# Patient Record
Sex: Female | Born: 1985 | Race: White | Hispanic: No | Marital: Married | Smoking: Never smoker
Health system: Southern US, Community
[De-identification: ages and names within clinical notes are randomized; demographics above are authoritative.]

## PROBLEM LIST (undated history)

## (undated) DIAGNOSIS — T4145XA Adverse effect of unspecified anesthetic, initial encounter: Secondary | ICD-10-CM

## (undated) DIAGNOSIS — G43909 Migraine, unspecified, not intractable, without status migrainosus: Secondary | ICD-10-CM

## (undated) DIAGNOSIS — T883XXA Malignant hyperthermia due to anesthesia, initial encounter: Secondary | ICD-10-CM

## (undated) DIAGNOSIS — N200 Calculus of kidney: Secondary | ICD-10-CM

## (undated) DIAGNOSIS — T8859XA Other complications of anesthesia, initial encounter: Secondary | ICD-10-CM

## (undated) DIAGNOSIS — Z8489 Family history of other specified conditions: Secondary | ICD-10-CM

---
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
## 2012-11-15 HISTORY — PX: KIDNEY STONE SURGERY: SHX686

## 2016-06-28 ENCOUNTER — Emergency Department (HOSPITAL_BASED_OUTPATIENT_CLINIC_OR_DEPARTMENT_OTHER)
Admission: EM | Admit: 2016-06-28 | Discharge: 2016-06-28 | Disposition: A | Discharge status: Home / Self Care | Payer: BC Managed Care – PPO | Attending: Emergency Medicine | Admitting: Emergency Medicine

## 2016-06-28 ENCOUNTER — Encounter (HOSPITAL_BASED_OUTPATIENT_CLINIC_OR_DEPARTMENT_OTHER): Payer: Self-pay | Admitting: *Deleted

## 2016-06-28 DIAGNOSIS — G43809 Other migraine, not intractable, without status migrainosus: Secondary | ICD-10-CM | POA: Diagnosis not present

## 2016-06-28 DIAGNOSIS — R112 Nausea with vomiting, unspecified: Secondary | ICD-10-CM | POA: Diagnosis not present

## 2016-06-28 DIAGNOSIS — G43909 Migraine, unspecified, not intractable, without status migrainosus: Secondary | ICD-10-CM | POA: Diagnosis present

## 2016-06-28 HISTORY — DX: Migraine, unspecified, not intractable, without status migrainosus: G43.909

## 2016-06-28 HISTORY — DX: Calculus of kidney: N20.0

## 2016-06-28 MED ORDER — PROCHLORPERAZINE EDISYLATE 5 MG/ML IJ SOLN
10.0000 mg | Freq: Once | INTRAMUSCULAR | Status: AC
  Administered 2016-06-28: 10 mg via INTRAVENOUS
  Filled 2016-06-28: qty 2

## 2016-06-28 MED ORDER — SODIUM CHLORIDE 0.9 % IV BOLUS (SEPSIS)
1000.0000 mL | Freq: Once | INTRAVENOUS | Status: AC
  Administered 2016-06-28: 1000 mL via INTRAVENOUS

## 2016-06-28 MED ORDER — KETOROLAC TROMETHAMINE 30 MG/ML IJ SOLN
30.0000 mg | Freq: Once | INTRAMUSCULAR | Status: AC
  Administered 2016-06-28: 30 mg via INTRAVENOUS
  Filled 2016-06-28: qty 1

## 2016-06-28 MED ORDER — LORAZEPAM 2 MG/ML IJ SOLN
1.0000 mg | Freq: Once | INTRAMUSCULAR | Status: AC
  Administered 2016-06-28: 1 mg via INTRAVENOUS
  Filled 2016-06-28: qty 1

## 2016-06-28 MED ORDER — MAGNESIUM SULFATE 2 GM/50ML IV SOLN
2.0000 g | Freq: Once | INTRAVENOUS | Status: AC
  Administered 2016-06-28: 2 g via INTRAVENOUS
  Filled 2016-06-28: qty 50

## 2016-06-28 MED ORDER — SODIUM CHLORIDE 0.9 % IV BOLUS (SEPSIS): 1000.0000 mL | Freq: Once | INTRAVENOUS | Status: DC

## 2016-06-28 MED ORDER — DEXAMETHASONE SODIUM PHOSPHATE 10 MG/ML IJ SOLN
10.0000 mg | Freq: Once | INTRAMUSCULAR | Status: AC
  Administered 2016-06-28: 10 mg via INTRAVENOUS
  Filled 2016-06-28: qty 1

## 2016-06-28 NOTE — ED Triage Notes (Signed)
Headache. Hx of migraines. Nausea, light and sound sensitivity.

## 2016-06-28 NOTE — Discharge Instructions (Signed)
Try to rest, drink plenty of fluids. Follow up with headache specialist if continue to have migraines. Return if worsening.

## 2016-06-28 NOTE — ED Notes (Signed)
PA at bedside.

## 2016-06-28 NOTE — ED Provider Notes (Signed)
[facility]   CSN: [id number] Arrival date & time: 06/28/16  1035     History   Chief Complaint Chief Complaint  Patient presents with  . Migraine    HPI Denise [name] is a 30 y.o. female.  HPI Denise [name] is a 30 y.o. female with history of kidney stones and migraine headaches, presents to emergency department complaining of a headache. Patient states her headache started 5 days ago. She reports headache to the left side of the head and around her left eye. She reports photophobia, nausea, vomiting, dizziness. States this headache feels just like her prior migraines. She has taken Reglan and Zofran with no improvement. She denies any trauma. No fever or chills. No neck pain or stiffness. No other associated complaints. No neurological complaints.  Past Medical History:  Diagnosis Date  . Kidney stone   . Migraine     There are no active problems to display for this patient.   History reviewed. No pertinent surgical history.  OB History    No data available       Home Medications    Prior to Admission medications   Not on File    Family History No family history on file.  Social History Social History  Substance Use Topics  . Smoking status: Never Smoker  . Smokeless tobacco: Never Used  . Alcohol use Yes     Comment: occ     Allergies   Review of patient's allergies indicates no known allergies.   Review of Systems Review of Systems  Constitutional: Negative for chills and fever.  Respiratory: Negative for cough, chest tightness and shortness of breath.   Cardiovascular: Negative for chest pain, palpitations and leg swelling.  Gastrointestinal: Positive for nausea and vomiting. Negative for abdominal pain and diarrhea.  Genitourinary: Negative for dysuria, flank pain and pelvic pain.  Musculoskeletal: Negative for arthralgias, myalgias, neck pain and neck stiffness.  Skin: Negative for rash.  Neurological: Positive  for headaches. Negative for dizziness and weakness.  All other systems reviewed and are negative.    Physical Exam Updated Vital Signs BP 135/86 (BP Location: Left Arm)   Pulse 89   Temp 97.8 F (36.6 C) (Oral)   Resp 17   Ht 5\' 5"  (1.651 m)   Wt 95.3 kg   LMP 06/21/2016   SpO2 100%   BMI 34.95 kg/m   Physical Exam  Constitutional: She is oriented to person, place, and time. She appears well-developed and well-nourished. No distress.  HENT:  Head: Normocephalic and atraumatic.  Eyes: Conjunctivae and EOM are normal. Pupils are equal, round, and reactive to light.  Neck: Normal range of motion. Neck supple.  No meningismus  Cardiovascular: Normal rate, regular rhythm and normal heart sounds.   Pulmonary/Chest: Effort normal and breath sounds normal. No respiratory distress.  Musculoskeletal: She exhibits no edema.  Neurological: She is alert and oriented to person, place, and time. No cranial nerve deficit. She exhibits normal muscle tone. Coordination normal.  5/5 and equal upper and lower extremity strength bilaterally. Equal grip strength bilaterally. Normal finger to nose and heel to shin. No pronator drift.   Skin: Skin is warm and dry. No rash noted.  Psychiatric: She has a normal mood and affect. Her behavior is normal.  Nursing note and vitals reviewed.    ED Treatments / Results  Labs (all labs ordered are listed, but only abnormal results are displayed) Labs Reviewed - No data to display  EKG  EKG Interpretation None       Radiology No results found.  Procedures Procedures (including critical care time)  Medications Ordered in ED Medications  sodium chloride 0.9 % bolus 1,000 mL (1,000 mLs Intravenous New Bag/Given 06/28/16 1251)  magnesium sulfate IVPB 2 g 50 mL (not administered)  LORazepam (ATIVAN) injection 1 mg (not administered)  sodium chloride 0.9 % bolus 1,000 mL (not administered)  ketorolac (TORADOL) 30 MG/ML injection 30 mg (30 mg  Intravenous Given 06/28/16 1250)  prochlorperazine (COMPAZINE) injection 10 mg (10 mg Intravenous Given 06/28/16 1250)  dexamethasone (DECADRON) injection 10 mg (10 mg Intravenous Given 06/28/16 1250)     Initial Impression / Assessment and Plan / ED Course  I have reviewed the triage vital signs and the nursing notes.  Pertinent labs & imaging results that were available during my care of the patient were reviewed by me and considered in my medical decision making (see chart for details).  Clinical Course  Comment By Time  No headache improvement after toradol, compazine, decadron. Will try ativan and magnesium Jaynie Crumbleatyana Dublin Cantero, PA-C 08/14 1342  Pt feels much better. Plan to dc home with outpatient follow up.  Jaynie Crumbleatyana Levell Tavano, PA-C 08/14 1506    Pt in emergency dept with typical for her migraine. No neurological finding. Treated with migraine cocktail, with no improvement after toradol, compazine,decatron, and good pain relief after ativan and magnesium. VS normal. No signs of meningismus or trauma. Pt has multiple stressors and fostering 3 children under age of 2 including a 4wk old baby. Pt is Stable for dc home with close outpatient follow up.  Vitals:   06/28/16 1049 06/28/16 1051 06/28/16 1256 06/28/16 1504  BP:  141/96 135/86 119/64  Pulse:  101 89 103  Resp:  18 17 25   Temp:  97.8 F (36.6 C)    TempSrc:  Oral    SpO2:  100% 100% 95%  Weight: 95.3 kg     Height: 5\' 5"  (1.651 m)       Final Clinical Impressions(s) / ED Diagnoses   Final diagnoses:  Other migraine without status migrainosus, not intractable    New Prescriptions New Prescriptions   No medications on file     Jaynie Crumbleatyana Kendyn Zaman, PA-C 06/28/16 1512    Jerelyn ScottMartha Linker, MD 06/28/16 1527

## 2017-03-28 ENCOUNTER — Ambulatory Visit: Payer: BC Managed Care – PPO | Admitting: Obstetrics and Gynecology

## 2017-04-07 ENCOUNTER — Encounter: Payer: Self-pay | Admitting: Obstetrics & Gynecology

## 2017-04-07 ENCOUNTER — Ambulatory Visit (INDEPENDENT_AMBULATORY_CARE_PROVIDER_SITE_OTHER): Payer: BC Managed Care – PPO | Admitting: Obstetrics & Gynecology

## 2017-04-07 DIAGNOSIS — N96 Recurrent pregnancy loss: Secondary | ICD-10-CM | POA: Diagnosis not present

## 2017-04-07 NOTE — Progress Notes (Signed)
Patient ID: Denise [name], female   DOB: 05/01/1986, 31 y.o.   MRN: [id number]  Chief Complaint  Patient presents with  . Gynecologic Exam  she has had 4 early pregnancy losses  HPI Denise [name] is a 31 y.o. female.  [id number] Patient's last menstrual period was 03/30/2017. Menses are regular and cycle is 31 days. No pregnancy has progressed past 7 weeks and she has required no surgery. MTX was used for a possible ectopic pregnancy.  She was seeing an OB in [place] and has has no workup for recurrent losses. Notes from her family MD were reviewed. HPI  Past Medical History:  Diagnosis Date  . Kidney stone   . Migraine     Past Surgical History:  Procedure Laterality Date  . KIDNEY STONE SURGERY  2014   L and R     Family History  Problem Relation Age of Onset  . Endometrial cancer Mother   . Breast cancer Maternal Grandmother   . Skin cancer Maternal Grandmother   . Lung cancer Maternal Grandmother   . Stroke Maternal Grandfather   . Endometrial cancer Sister     Social History Social History  Substance Use Topics  . Smoking status: Never Smoker  . Smokeless tobacco: Never Used  . Alcohol use Yes     Comment: occ    No Known Allergies  No current outpatient prescriptions on file.   No current facility-administered medications for this visit.     Review of Systems Review of Systems  Constitutional: Negative.   Gastrointestinal: Negative.   Genitourinary: Negative for menstrual problem, pelvic pain, vaginal bleeding and vaginal discharge.    Blood pressure (!) 132/91, pulse (!) 120, height 5\' 4"  (1.626 m), weight 225 lb (102.1 kg), last menstrual period 03/30/2017.  Physical Exam Physical Exam  Constitutional: She is oriented to person, place, and time. She appears well-developed. No distress.  HENT:  Head: Normocephalic and atraumatic.  Eyes: Pupils are equal, round, and reactive to light.  Cardiovascular: Normal rate.   Pulmonary/Chest: Effort  normal.  Neurological: She is alert and oriented to person, place, and time.  Psychiatric: She has a normal mood and affect. Her behavior is normal.    Data Reviewed  office notes and care everywhere  Assessment    Patient Active Problem List   Diagnosis Date Noted  . 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

## 2017-04-07 NOTE — Patient Instructions (Signed)

## 2017-04-07 NOTE — Progress Notes (Signed)
Pt presents for annual and to discuss fertility issues. She does not have issues getting pregnant, she has issues staying pregnant. Within the last 18 months, she has had four SAB's.

## 2017-04-10 LAB — CARDIOLIPIN ANTIBODIES, IGG, IGM, IGA
Anticardiolipin IgA: <9 APL U/mL (ref 0–11)
Anticardiolipin IgM: 19 MPL U/mL — ABNORMAL HIGH (ref 0–12)

## 2017-04-10 LAB — LUPUS ANTICOAGULANT PANEL
DRVVT: 39.8 s (ref 0.0–47.0)
PTT LA: 31.7 s (ref 0.0–51.9)

## 2017-04-10 LAB — TSH: TSH: 1.95 u[IU]/mL (ref 0.450–4.500)

## 2017-06-06 ENCOUNTER — Ambulatory Visit (HOSPITAL_COMMUNITY)
Admission: RE | Admit: 2017-06-06 | Discharge: 2017-06-06 | Disposition: A | Discharge status: Home / Self Care | Payer: BC Managed Care – PPO | Source: Ambulatory Visit | Attending: Obstetrics & Gynecology | Admitting: Obstetrics & Gynecology

## 2017-06-06 ENCOUNTER — Encounter (HOSPITAL_COMMUNITY): Payer: Self-pay

## 2017-06-06 ENCOUNTER — Encounter (INDEPENDENT_AMBULATORY_CARE_PROVIDER_SITE_OTHER): Payer: Self-pay

## 2017-06-06 DIAGNOSIS — N96 Recurrent pregnancy loss: Secondary | ICD-10-CM | POA: Diagnosis present

## 2017-06-06 MED ORDER — IOPAMIDOL (ISOVUE-300) INJECTION 61%
30.0000 mL | Freq: Once | INTRAVENOUS | Status: AC | PRN
  Administered 2017-06-06: 30 mL

## 2017-06-13 ENCOUNTER — Telehealth: Payer: Self-pay | Admitting: *Deleted

## 2017-06-13 NOTE — Telephone Encounter (Signed)
-----   Message from Adam PhenixJames G Arnold, MD sent at 06/13/2017  9:34 AM EDT ----- Nl endometrium, f/u as scheduled

## 2017-06-13 NOTE — Telephone Encounter (Signed)
I called Denise [name] per Dr. Olivia MackieArnold's message and left a message I am calling with some non- urgent results- please call our office.

## 2017-06-14 NOTE — Telephone Encounter (Signed)
Called pt and informed her of test results showing normal endometrium.  She should keep appt as scheduled on 8/14 and Dr. Debroah LoopArnold will discuss her results in detail as well as plan of care going forward.  Pt voiced understanding of all information and instructions given.

## 2017-06-28 ENCOUNTER — Encounter: Payer: Self-pay | Admitting: Obstetrics & Gynecology

## 2017-06-28 ENCOUNTER — Ambulatory Visit (INDEPENDENT_AMBULATORY_CARE_PROVIDER_SITE_OTHER): Payer: BC Managed Care – PPO | Admitting: Obstetrics & Gynecology

## 2017-06-28 VITALS — BP 146/100 | HR 81 | Wt 222.0 lb

## 2017-06-28 DIAGNOSIS — N96 Recurrent pregnancy loss: Secondary | ICD-10-CM | POA: Diagnosis not present

## 2017-06-28 NOTE — Patient Instructions (Signed)
Recurrent Pregnancy Loss Recurrent pregnancy loss is the loss of three or more pregnancies before 20 weeks of gestation. Losing three or more pregnancies in a row is rare. What are the causes? The most common cause of recurrent pregnancy loss is an abnormal number of chromosomes in the developing baby (fetus). Chromosomes are the structures inside cells that hold all your genetic material. In most cases of recurrent pregnancy loss, a missing or extra chromosome keeps a baby from developing. It may not be possible to identify which chromosome is defective. Chromosome abnormalities can be inherited, but most of the time they occur by chance. Other possible causes of recurrent pregnancy loss include:  Being born with an abnormal womb structure (septate uterus).  Having noncancerous growths in your uterus (fibroids or polyps).  Having a disease that causes scarring in your uterus (Asherman syndrome).  Having a disease that causes your blood to clot (antiphospholipid syndrome).  Having a disease that increases bleeding (thrombophilia).  What increases the risk? The risk of recurrent pregnancy loss increases as your age increases. Other risk factors include:  Diabetes.  Thyroid disease.  Obesity.  Smoking.  Excessive alcohol or caffeine.  Recreational drug use.  What are the signs or symptoms?  Vaginal bleeding.  Passing clots vaginally.  Abdominal pain or cramps.  Low back pain. How is this diagnosed? Your health care provider can create an image of your womb using sound waves and a computer (ultrasound) to confirm your pregnancy by 5 or 6 weeks after you conceive. Ultrasound can also confirm a pregnancy loss. Your health care provider may do a complete physical exam, including your vagina and uterus (pelvic exam), to find possible causes of recurrent pregnancy loss. Other tests may include:  Ultrasound imaging to see if the structure of your uterus is normal or if you have  polyps or fibroids.  Blood tests to see if you have a disease that causes recurrent pregnancy loss.  Blood tests to see if your blood clots normally.  Genetic testing of you and your partner.  How is this treated? In many cases, there is no specific treatment. Depending on the cause, possible treatments include:  Having your eggs fertilized outside your uterus (in vitro fertilization). By doing this, a health care provider may be able to select eggs without chromosome abnormalities.  Taking a blood thinner to prevent clotting if you have antiphospholipid syndrome.  Having corrective surgery if you have an abnormality in your uterus.  Follow these instructions at home: Follow all your health care provider's home instructions carefully. These may include:  Do not smoke or use recreational drugs.  Do not drink alcohol if you are pregnant.  Do not put anything in your vagina or have sex for 2 weeks after you have had a miscarriage.  If you are Rh negative and have had a miscarriage, you may need to get a shot of Rho (D) immune globulin. Ask your doctor if you need this shot.  Use birth control if you do not want to get pregnant. You can get pregnant [redacted] weeks after a miscarriage.  Get support from friends and loved ones. Miscarriage can be a sad and stressful event.  Contact a health care provider if:  You have light vaginal bleeding or spotting while pregnant.  You have been trying to get pregnant without success.  You are struggling with sadness or depression after a miscarriage. Get help right away if:  You have heavy vaginal bleeding and abdominal cramps.  You   have fever, chills, and severe abdominal pain. This information is not intended to replace advice given to you by your health care provider. Make sure you discuss any questions you have with your health care provider. Document Released: 04/19/2008 Document Revised: 04/08/2016 Document Reviewed: 08/24/2013 Elsevier  Interactive Patient Education  2018 [employer]  

## 2017-06-28 NOTE — Progress Notes (Signed)
Patient presents for follow up to Hysterogram. Patient is currently having a Migraine headache it is a 6 on the pain scale.

## 2017-06-28 NOTE — Progress Notes (Signed)
Subjective:     Patient ID: Denise [name], female   DOB: Apr 06, 1986, 31 y.o.   MRN: [id number] CC: f/u evaluation for pregnancy loss HPI [id number] Patient's last menstrual period was 05/27/2017 (exact date). Patient had HSG and labs for recurrent pregnancy loss.   Past Medical History:  Diagnosis Date  . Kidney stone   . Migraine    Past Surgical History:  Procedure Laterality Date  . KIDNEY STONE SURGERY  2014   L and R       Review of Systems  Genitourinary: Negative for menstrual problem, pelvic pain, vaginal bleeding and vaginal discharge.  Neurological: Positive for headaches.       Objective:   Physical Exam  Constitutional: She is oriented to person, place, and time. She appears well-developed. No distress.  Cardiovascular: Normal rate.   Neurological: She is alert and oriented to person, place, and time.  Psychiatric: She has a normal mood and affect. Her behavior is normal.   Blood pressure (!) 146/100, pulse 81, weight 100.7 kg (222 lb), last menstrual period 05/27/2017.     Assessment:     Patient Active Problem List   Diagnosis Date Noted  . 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 April MansonYalcinkaya for evaluation and management  Adam PhenixArnold, Eshani Maestre G, MD 06/28/2017

## 2017-11-24 DIAGNOSIS — G43109 Migraine with aura, not intractable, without status migrainosus: Secondary | ICD-10-CM | POA: Insufficient documentation

## 2019-08-13 ENCOUNTER — Encounter: Payer: BC Managed Care – PPO | Admitting: Obstetrics and Gynecology

## 2019-08-16 ENCOUNTER — Ambulatory Visit (INDEPENDENT_AMBULATORY_CARE_PROVIDER_SITE_OTHER): Payer: BC Managed Care – PPO | Admitting: Obstetrics and Gynecology

## 2019-08-16 ENCOUNTER — Other Ambulatory Visit (HOSPITAL_COMMUNITY)
Admission: RE | Admit: 2019-08-16 | Discharge: 2019-08-16 | Disposition: A | Discharge status: Home / Self Care | Payer: BC Managed Care – PPO | Source: Ambulatory Visit | Attending: Obstetrics and Gynecology | Admitting: Obstetrics and Gynecology

## 2019-08-16 ENCOUNTER — Other Ambulatory Visit: Payer: Self-pay

## 2019-08-16 ENCOUNTER — Encounter: Payer: Self-pay | Admitting: Obstetrics and Gynecology

## 2019-08-16 VITALS — BP 134/91 | HR 99 | Temp 98.8°F | Wt 219.3 lb

## 2019-08-16 DIAGNOSIS — N96 Recurrent pregnancy loss: Secondary | ICD-10-CM

## 2019-08-16 DIAGNOSIS — O0992 Supervision of high risk pregnancy, unspecified, second trimester: Secondary | ICD-10-CM

## 2019-08-16 DIAGNOSIS — Z6791 Unspecified blood type, Rh negative: Secondary | ICD-10-CM

## 2019-08-16 DIAGNOSIS — Z3A15 15 weeks gestation of pregnancy: Secondary | ICD-10-CM

## 2019-08-16 DIAGNOSIS — O26892 Other specified pregnancy related conditions, second trimester: Secondary | ICD-10-CM

## 2019-08-16 DIAGNOSIS — O10919 Unspecified pre-existing hypertension complicating pregnancy, unspecified trimester: Secondary | ICD-10-CM | POA: Insufficient documentation

## 2019-08-16 DIAGNOSIS — O0990 Supervision of high risk pregnancy, unspecified, unspecified trimester: Secondary | ICD-10-CM | POA: Insufficient documentation

## 2019-08-16 DIAGNOSIS — O2622 Pregnancy care for patient with recurrent pregnancy loss, second trimester: Secondary | ICD-10-CM

## 2019-08-16 DIAGNOSIS — O10912 Unspecified pre-existing hypertension complicating pregnancy, second trimester: Secondary | ICD-10-CM

## 2019-08-16 MED ORDER — BLOOD PRESSURE KIT DEVI: 1.0000 | 0 refills | Status: DC | PRN

## 2019-08-16 MED ORDER — ASPIRIN EC 81 MG PO TBEC: 81.0000 mg | DELAYED_RELEASE_TABLET | Freq: Every day | ORAL | 2 refills | Status: DC

## 2019-08-16 MED ORDER — ENOXAPARIN SODIUM 40 MG/0.4ML ~~LOC~~ SOLN: 40.0000 mg | SUBCUTANEOUS | 6 refills | Status: DC

## 2019-08-16 NOTE — Patient Instructions (Signed)
° °Second Trimester of Pregnancy °The second trimester is from week 14 through week 27 (months 4 through 6). The second trimester is often a time when you feel your best. Your body has adjusted to being pregnant, and you begin to feel better physically. Usually, morning sickness has lessened or quit completely, you may have more energy, and you may have an increase in appetite. The second trimester is also a time when the fetus is growing rapidly. At the end of the sixth month, the fetus is about 9 inches long and weighs about 1½ pounds. You will likely begin to feel the baby move (quickening) between 16 and 20 weeks of pregnancy. °Body changes during your second trimester °Your body continues to go through many changes during your second trimester. The changes vary from woman to woman. °· Your weight will continue to increase. You will notice your lower abdomen bulging out. °· You may begin to get stretch marks on your hips, abdomen, and breasts. °· You may develop headaches that can be relieved by medicines. The medicines should be approved by your health care provider. °· You may urinate more often because the fetus is pressing on your bladder. °· You may develop or continue to have heartburn as a result of your pregnancy. °· You may develop constipation because certain hormones are causing the muscles that push waste through your intestines to slow down. °· You may develop hemorrhoids or swollen, bulging veins (varicose veins). °· You may have back pain. This is caused by: °? Weight gain. °? Pregnancy hormones that are relaxing the joints in your pelvis. °? A shift in weight and the muscles that support your balance. °· Your breasts will continue to grow and they will continue to become tender. °· Your gums may bleed and may be sensitive to brushing and flossing. °· Dark spots or blotches (chloasma, mask of pregnancy) may develop on your face. This will likely fade after the baby is born. °· A dark line from  your belly button to the pubic area (linea nigra) may appear. This will likely fade after the baby is born. °· You may have changes in your hair. These can include thickening of your hair, rapid growth, and changes in texture. Some women also have hair loss during or after pregnancy, or hair that feels dry or thin. Your hair will most likely return to normal after your baby is born. °What to expect at prenatal visits °During a routine prenatal visit: °· You will be weighed to make sure you and the fetus are growing normally. °· Your blood pressure will be taken. °· Your abdomen will be measured to track your baby's growth. °· The fetal heartbeat will be listened to. °· Any test results from the previous visit will be discussed. °Your health care provider may ask you: °· How you are feeling. °· If you are feeling the baby move. °· If you have had any abnormal symptoms, such as leaking fluid, bleeding, severe headaches, or abdominal cramping. °· If you are using any tobacco products, including cigarettes, chewing tobacco, and electronic cigarettes. °· If you have any questions. °Other tests that may be performed during your second trimester include: °· Blood tests that check for: °? Low iron levels (anemia). °? High blood sugar that affects pregnant women (gestational diabetes) between 24 and 28 weeks. °? Rh antibodies. This is to check for a protein on red blood cells (Rh factor). °· Urine tests to check for infections, diabetes, or protein in   the urine. °· An ultrasound to confirm the proper growth and development of the baby. °· An amniocentesis to check for possible genetic problems. °· Fetal screens for spina bifida and Down syndrome. °· HIV (human immunodeficiency virus) testing. Routine prenatal testing includes screening for HIV, unless you choose not to have this test. °Follow these instructions at home: °Medicines °· Follow your health care provider's instructions regarding medicine use. Specific medicines  may be either safe or unsafe to take during pregnancy. °· Take a prenatal vitamin that contains at least 600 micrograms (mcg) of folic acid. °· If you develop constipation, try taking a stool softener if your health care provider approves. °Eating and drinking ° °· Eat a balanced diet that includes fresh fruits and vegetables, whole grains, good sources of protein such as meat, eggs, or tofu, and low-fat dairy. Your health care provider will help you determine the amount of weight gain that is right for you. °· Avoid raw meat and uncooked cheese. These carry germs that can cause birth defects in the baby. °· If you have low calcium intake from food, talk to your health care provider about whether you should take a daily calcium supplement. °· Limit foods that are high in fat and processed sugars, such as fried and sweet foods. °· To prevent constipation: °? Drink enough fluid to keep your urine clear or pale yellow. °? Eat foods that are high in fiber, such as fresh fruits and vegetables, whole grains, and beans. °Activity °· Exercise only as directed by your health care provider. Most women can continue their usual exercise routine during pregnancy. Try to exercise for 30 minutes at least 5 days a week. Stop exercising if you experience uterine contractions. °· Avoid heavy lifting, wear low heel shoes, and practice good posture. °· A sexual relationship may be continued unless your health care provider directs you otherwise. °Relieving pain and discomfort °· Wear a good support bra to prevent discomfort from breast tenderness. °· Take warm sitz baths to soothe any pain or discomfort caused by hemorrhoids. Use hemorrhoid cream if your health care provider approves. °· Rest with your legs elevated if you have leg cramps or low back pain. °· If you develop varicose veins, wear support hose. Elevate your feet for 15 minutes, 3-4 times a day. Limit salt in your diet. °Prenatal Care °· Write down your questions. Take  them to your prenatal visits. °· Keep all your prenatal visits as told by your health care provider. This is important. °Safety °· Wear your seat belt at all times when driving. °· Make a list of emergency phone numbers, including numbers for family, friends, the hospital, and police and fire departments. °General instructions °· Ask your health care provider for a referral to a local prenatal education class. Begin classes no later than the beginning of month 6 of your pregnancy. °· Ask for help if you have counseling or nutritional needs during pregnancy. Your health care provider can offer advice or refer you to specialists for help with various needs. °· Do not use hot tubs, steam rooms, or saunas. °· Do not douche or use tampons or scented sanitary pads. °· Do not cross your legs for long periods of time. °· Avoid cat litter boxes and soil used by cats. These carry germs that can cause birth defects in the baby and possibly loss of the fetus by miscarriage or stillbirth. °· Avoid all smoking, herbs, alcohol, and unprescribed drugs. Chemicals in these products can affect the   formation and growth of the baby.  Do not use any products that contain nicotine or tobacco, such as cigarettes and e-cigarettes. If you need help quitting, ask your health care provider.  Visit your dentist if you have not gone yet during your pregnancy. Use a soft toothbrush to brush your teeth and be gentle when you floss. Contact a health care provider if:  You have dizziness.  You have mild pelvic cramps, pelvic pressure, or nagging pain in the abdominal area.  You have persistent nausea, vomiting, or diarrhea.  You have a bad smelling vaginal discharge.  You have pain when you urinate. Get help right away if:  You have a fever.  You are leaking fluid from your vagina.  You have spotting or bleeding from your vagina.  You have severe abdominal cramping or pain.  You have rapid weight gain or weight loss.  You  have shortness of breath with chest pain.  You notice sudden or extreme swelling of your face, hands, ankles, feet, or legs.  You have not felt your baby move in over an hour.  You have severe headaches that do not go away when you take medicine.  You have vision changes. Summary  The second trimester is from week 14 through week 27 (months 4 through 6). It is also a time when the fetus is growing rapidly.  Your body goes through many changes during pregnancy. The changes vary from woman to woman.  Avoid all smoking, herbs, alcohol, and unprescribed drugs. These chemicals affect the formation and growth your baby.  Do not use any tobacco products, such as cigarettes, chewing tobacco, and e-cigarettes. If you need help quitting, ask your health care provider.  Contact your health care provider if you have any questions. Keep all prenatal visits as told by your health care provider. This is important. This information is not intended to replace advice given to you by your health care provider. Make sure you discuss any questions you have with your health care provider. Document Released: 10/26/2001 Document Revised: 02/23/2019 Document Reviewed: 12/07/2016 Elsevier Patient Education  2020 [employer].   Contraception Choices Contraception, also called birth control, refers to methods or devices that prevent pregnancy. Hormonal methods Contraceptive implant  A contraceptive implant is a thin, plastic tube that contains a hormone. It is inserted into the upper part of the arm. It can remain in place for up to 3 years. Progestin-only injections Progestin-only injections are injections of progestin, a synthetic form of the hormone progesterone. They are given every 3 months by a health care provider. Birth control pills  Birth control pills are pills that contain hormones that prevent pregnancy. They must be taken once a day, preferably at the same time each day. Birth control  patch  The birth control patch contains hormones that prevent pregnancy. It is placed on the skin and must be changed once a week for three weeks and removed on the fourth week. A prescription is needed to use this method of contraception. Vaginal ring  A vaginal ring contains hormones that prevent pregnancy. It is placed in the vagina for three weeks and removed on the fourth week. After that, the process is repeated with a new ring. A prescription is needed to use this method of contraception. Emergency contraceptive Emergency contraceptives prevent pregnancy after unprotected sex. They come in pill form and can be taken up to 5 days after sex. They work best the sooner they are taken after having sex. Most emergency  contraceptives are available without a prescription. This method should not be used as your only form of birth control. Barrier methods Female condom  A female condom is a thin sheath that is worn over the penis during sex. Condoms keep sperm from going inside a woman's body. They can be used with a spermicide to increase their effectiveness. They should be disposed after a single use. Female condom  A female condom is a soft, loose-fitting sheath that is put into the vagina before sex. The condom keeps sperm from going inside a woman's body. They should be disposed after a single use. Diaphragm  A diaphragm is a soft, dome-shaped barrier. It is inserted into the vagina before sex, along with a spermicide. The diaphragm blocks sperm from entering the uterus, and the spermicide kills sperm. A diaphragm should be left in the vagina for 6-8 hours after sex and removed within 24 hours. A diaphragm is prescribed and fitted by a health care provider. A diaphragm should be replaced every 1-2 years, after giving birth, after gaining more than 15 lb (6.8 kg), and after pelvic surgery. Cervical cap  A cervical cap is a round, soft latex or plastic cup that fits over the cervix. It is inserted  into the vagina before sex, along with spermicide. It blocks sperm from entering the uterus. The cap should be left in place for 6-8 hours after sex and removed within 48 hours. A cervical cap must be prescribed and fitted by a health care provider. It should be replaced every 2 years. Sponge  A sponge is a soft, circular piece of polyurethane foam with spermicide on it. The sponge helps block sperm from entering the uterus, and the spermicide kills sperm. To use it, you make it wet and then insert it into the vagina. It should be inserted before sex, left in for at least 6 hours after sex, and removed and thrown away within 30 hours. Spermicides Spermicides are chemicals that kill or block sperm from entering the cervix and uterus. They can come as a cream, jelly, suppository, foam, or tablet. A spermicide should be inserted into the vagina with an applicator at least 10-15 minutes before sex to allow time for it to work. The process must be repeated every time you have sex. Spermicides do not require a prescription. Intrauterine contraception Intrauterine device (IUD) An IUD is a T-shaped device that is put in a woman's uterus. There are two types:  Hormone IUD.This type contains progestin, a synthetic form of the hormone progesterone. This type can stay in place for 3-5 years.  Copper IUD.This type is wrapped in copper wire. It can stay in place for 10 years.  Permanent methods of contraception Female tubal ligation In this method, a woman's fallopian tubes are sealed, tied, or blocked during surgery to prevent eggs from traveling to the uterus. Hysteroscopic sterilization In this method, a small, flexible insert is placed into each fallopian tube. The inserts cause scar tissue to form in the fallopian tubes and block them, so sperm cannot reach an egg. The procedure takes about 3 months to be effective. Another form of birth control must be used during those 3 months. Female sterilization This  is a procedure to tie off the tubes that carry sperm (vasectomy). After the procedure, the man can still ejaculate fluid (semen). Natural planning methods Natural family planning In this method, a couple does not have sex on days when the woman could become pregnant. Calendar method This means keeping track  of the length of each menstrual cycle, identifying the days when pregnancy can happen, and not having sex on those days. °Ovulation method °In this method, a couple avoids sex during ovulation. °Symptothermal method °This method involves not having sex during ovulation. The woman typically checks for ovulation by watching changes in her temperature and in the consistency of cervical mucus. °Post-ovulation method °In this method, a couple waits to have sex until after ovulation. °Summary °· Contraception, also called birth control, means methods or devices that prevent pregnancy. °· Hormonal methods of contraception include implants, injections, pills, patches, vaginal rings, and emergency contraceptives. °· Barrier methods of contraception can include female condoms, female condoms, diaphragms, cervical caps, sponges, and spermicides. °· There are two types of IUDs (intrauterine devices). An IUD can be put in a woman's uterus to prevent pregnancy for 3-5 years. °· Permanent sterilization can be done through a procedure for males, females, or both. °· Natural family planning methods involve not having sex on days when the woman could become pregnant. °This information is not intended to replace advice given to you by your health care provider. Make sure you discuss any questions you have with your health care provider. °Document Released: 11/01/2005 Document Revised: 11/03/2017 Document Reviewed: 12/04/2016 °Elsevier Patient Education © 2020 [employer] ° ° °Breastfeeding ° °Choosing to breastfeed is one of the best decisions you can make for yourself and your baby. A change in hormones during pregnancy  causes your breasts to make breast milk in your milk-producing glands. Hormones prevent breast milk from being released before your baby is born. They also prompt milk flow after birth. Once breastfeeding has begun, thoughts of your baby, as well as his or her sucking or crying, can stimulate the release of milk from your milk-producing glands. °Benefits of breastfeeding °Research shows that breastfeeding offers many health benefits for infants and mothers. It also offers a cost-free and convenient way to feed your baby. °For your baby °· Your first milk (colostrum) helps your baby's digestive system to function better. °· Special cells in your milk (antibodies) help your baby to fight off infections. °· Breastfed babies are less likely to develop asthma, allergies, obesity, or type 2 diabetes. They are also at lower risk for sudden infant death syndrome (SIDS). °· Nutrients in breast milk are better able to meet your baby’s needs compared to infant formula. °· Breast milk improves your baby's brain development. °For you °· Breastfeeding helps to create a very special bond between you and your baby. °· Breastfeeding is convenient. Breast milk costs nothing and is always available at the correct temperature. °· Breastfeeding helps to burn calories. It helps you to lose the weight that you gained during pregnancy. °· Breastfeeding makes your uterus return faster to its size before pregnancy. It also slows bleeding (lochia) after you give birth. °· Breastfeeding helps to lower your risk of developing type 2 diabetes, osteoporosis, rheumatoid arthritis, cardiovascular disease, and breast, ovarian, uterine, and endometrial cancer later in life. °Breastfeeding basics °Starting breastfeeding °· Find a comfortable place to sit or lie down, with your neck and back well-supported. °· Place a pillow or a rolled-up blanket under your baby to bring him or her to the level of your breast (if you are seated). Nursing pillows are  specially designed to help support your arms and your baby while you breastfeed. °· Make sure that your baby's tummy (abdomen) is facing your abdomen. °· Gently massage your breast. With your fingertips, massage from the outer   edges of your breast inward toward the nipple. This encourages milk flow. If your milk flows slowly, you may need to continue this action during the feeding.  Support your breast with 4 fingers underneath and your thumb above your nipple (make the letter "C" with your hand). Make sure your fingers are well away from your nipple and your babys mouth.  Stroke your baby's lips gently with your finger or nipple.  When your baby's mouth is open wide enough, quickly bring your baby to your breast, placing your entire nipple and as much of the areola as possible into your baby's mouth. The areola is the colored area around your nipple. ? More areola should be visible above your baby's upper lip than below the lower lip. ? Your baby's lips should be opened and extended outward (flanged) to ensure an adequate, comfortable latch. ? Your baby's tongue should be between his or her lower gum and your breast.  Make sure that your baby's mouth is correctly positioned around your nipple (latched). Your baby's lips should create a seal on your breast and be turned out (everted).  It is common for your baby to suck about 2-3 minutes in order to start the flow of breast milk. Latching Teaching your baby how to latch onto your breast properly is very important. An improper latch can cause nipple pain, decreased milk supply, and poor weight gain in your baby. Also, if your baby is not latched onto your nipple properly, he or she may swallow some air during feeding. This can make your baby fussy. Burping your baby when you switch breasts during the feeding can help to get rid of the air. However, teaching your baby to latch on properly is still the best way to prevent fussiness from swallowing air  while breastfeeding. Signs that your baby has successfully latched onto your nipple  Silent tugging or silent sucking, without causing you pain. Infant's lips should be extended outward (flanged).  Swallowing heard between every 3-4 sucks once your milk has started to flow (after your let-down milk reflex occurs).  Muscle movement above and in front of his or her ears while sucking. Signs that your baby has not successfully latched onto your nipple  Sucking sounds or smacking sounds from your baby while breastfeeding.  Nipple pain. If you think your baby has not latched on correctly, slip your finger into the corner of your babys mouth to break the suction and place it between your baby's gums. Attempt to start breastfeeding again. Signs of successful breastfeeding Signs from your baby  Your baby will gradually decrease the number of sucks or will completely stop sucking.  Your baby will fall asleep.  Your baby's body will relax.  Your baby will retain a small amount of milk in his or her mouth.  Your baby will let go of your breast by himself or herself. Signs from you  Breasts that have increased in firmness, weight, and size 1-3 hours after feeding.  Breasts that are softer immediately after breastfeeding.  Increased milk volume, as well as a change in milk consistency and color by the fifth day of breastfeeding.  Nipples that are not sore, cracked, or bleeding. Signs that your baby is getting enough milk  Wetting at least 1-2 diapers during the first 24 hours after birth.  Wetting at least 5-6 diapers every 24 hours for the first week after birth. The urine should be clear or pale yellow by the age of 5 days.  Wetting  6-8 diapers every 24 hours as your baby continues to grow and develop.  At least 3 stools in a 24-hour period by the age of 5 days. The stool should be soft and yellow.  At least 3 stools in a 24-hour period by the age of 7 days. The stool should be  seedy and yellow.  No loss of weight greater than 10% of birth weight during the first 3 days of life.  Average weight gain of 4-7 oz (113-198 g) per week after the age of 4 days.  Consistent daily weight gain by the age of 5 days, without weight loss after the age of 2 weeks. After a feeding, your baby may spit up a small amount of milk. This is normal. Breastfeeding frequency and duration Frequent feeding will help you make more milk and can prevent sore nipples and extremely full breasts (breast engorgement). Breastfeed when you feel the need to reduce the fullness of your breasts or when your baby shows signs of hunger. This is called "breastfeeding on demand." Signs that your baby is hungry include:  Increased alertness, activity, or restlessness.  Movement of the head from side to side.  Opening of the mouth when the corner of the mouth or cheek is stroked (rooting).  Increased sucking sounds, smacking lips, cooing, sighing, or squeaking.  Hand-to-mouth movements and sucking on fingers or hands.  Fussing or crying. Avoid introducing a pacifier to your baby in the first 4-6 weeks after your baby is born. After this time, you may choose to use a pacifier. Research has shown that pacifier use during the first year of a baby's life decreases the risk of sudden infant death syndrome (SIDS). Allow your baby to feed on each breast as long as he or she wants. When your baby unlatches or falls asleep while feeding from the first breast, offer the second breast. Because newborns are often sleepy in the first few weeks of life, you may need to awaken your baby to get him or her to feed. Breastfeeding times will vary from baby to baby. However, the following rules can serve as a guide to help you make sure that your baby is properly fed:  Newborns (babies 82 weeks of age or younger) may breastfeed every 1-3 hours.  Newborns should not go without breastfeeding for longer than 3 hours during the  day or 5 hours during the night.  You should breastfeed your baby a minimum of 8 times in a 24-hour period. Breast milk pumping     Pumping and storing breast milk allows you to make sure that your baby is exclusively fed your breast milk, even at times when you are unable to breastfeed. This is especially important if you go back to work while you are still breastfeeding, or if you are not able to be present during feedings. Your lactation consultant can help you find a method of pumping that works best for you and give you guidelines about how long it is safe to store breast milk. Caring for your breasts while you breastfeed Nipples can become dry, cracked, and sore while breastfeeding. The following recommendations can help keep your breasts moisturized and healthy:  Avoid using soap on your nipples.  Wear a supportive bra designed especially for nursing. Avoid wearing underwire-style bras or extremely tight bras (sports bras).  Air-dry your nipples for 3-4 minutes after each feeding.  Use only cotton bra pads to absorb leaked breast milk. Leaking of breast milk between feedings is normal.  Use lanolin on your nipples after breastfeeding. Lanolin helps to maintain your skin's normal moisture barrier. Pure lanolin is not harmful (not toxic) to your baby. You may also hand express a few drops of breast milk and gently massage that milk into your nipples and allow the milk to air-dry. In the first few weeks after giving birth, some women experience breast engorgement. Engorgement can make your breasts feel heavy, warm, and tender to the touch. Engorgement peaks within 3-5 days after you give birth. The following recommendations can help to ease engorgement:  Completely empty your breasts while breastfeeding or pumping. You may want to start by applying warm, moist heat (in the shower or with warm, water-soaked hand towels) just before feeding or pumping. This increases circulation and helps the  milk flow. If your baby does not completely empty your breasts while breastfeeding, pump any extra milk after he or she is finished.  Apply ice packs to your breasts immediately after breastfeeding or pumping, unless this is too uncomfortable for you. To do this: ? Put ice in a plastic bag. ? Place a towel between your skin and the bag. ? Leave the ice on for 20 minutes, 2-3 times a day.  Make sure that your baby is latched on and positioned properly while breastfeeding. If engorgement persists after 48 hours of following these recommendations, contact your health care provider or a Advertising copywriter. Overall health care recommendations while breastfeeding  Eat 3 healthy meals and 3 snacks every day. Well-nourished mothers who are breastfeeding need an additional 450-500 calories a day. You can meet this requirement by increasing the amount of a balanced diet that you eat.  Drink enough water to keep your urine pale yellow or clear.  Rest often, relax, and continue to take your prenatal vitamins to prevent fatigue, stress, and low vitamin and mineral levels in your body (nutrient deficiencies).  Do not use any products that contain nicotine or tobacco, such as cigarettes and e-cigarettes. Your baby may be harmed by chemicals from cigarettes that pass into breast milk and exposure to secondhand smoke. If you need help quitting, ask your health care provider.  Avoid alcohol.  Do not use illegal drugs or marijuana.  Talk with your health care provider before taking any medicines. These include over-the-counter and prescription medicines as well as vitamins and herbal supplements. Some medicines that may be harmful to your baby can pass through breast milk.  It is possible to become pregnant while breastfeeding. If birth control is desired, ask your health care provider about options that will be safe while breastfeeding your baby. Where to find more information: [employer]  International: www.llli.org Contact a health care provider if:  You feel like you want to stop breastfeeding or have become frustrated with breastfeeding.  Your nipples are cracked or bleeding.  Your breasts are red, tender, or warm.  You have: ? Painful breasts or nipples. ? A swollen area on either breast. ? A fever or chills. ? Nausea or vomiting. ? Drainage other than breast milk from your nipples.  Your breasts do not become full before feedings by the fifth day after you give birth.  You feel sad and depressed.  Your baby is: ? Too sleepy to eat well. ? Having trouble sleeping. ? More than 19 week old and wetting fewer than 6 diapers in a 24-hour period. ? Not gaining weight by 42 days of age.  Your baby has fewer than 3 stools in a 24-hour period.  Your baby's skin or the white parts of his or her eyes become yellow. Get help right away if:  Your baby is overly tired (lethargic) and does not want to wake up and feed.  Your baby develops an unexplained fever. Summary  Breastfeeding offers many health benefits for infant and mothers.  Try to breastfeed your infant when he or she shows early signs of hunger.  Gently tickle or stroke your baby's lips with your finger or nipple to allow the baby to open his or her mouth. Bring the baby to your breast. Make sure that much of the areola is in your baby's mouth. Offer one side and burp the baby before you offer the other side.  Talk with your health care provider or lactation consultant if you have questions or you face problems as you breastfeed. This information is not intended to replace advice given to you by your health care provider. Make sure you discuss any questions you have with your health care provider. Document Released: 11/01/2005 Document Revised: 01/26/2018 Document Reviewed: 12/03/2016 Elsevier Patient Education  2020 [employer].

## 2019-08-16 NOTE — Progress Notes (Signed)
Subjective:    Denise [name] is a G5P0040 [redacted]w[redacted]d being seen today for her first obstetrical visit.  Her obstetrical history is significant for [facility] and recurrent first trimester pregnancy losses for which she is currently on anticoagulation. Patient does intend to breast feed. Pregnancy history fully reviewed.  Patient reports no complaints.  Vitals:   08/16/19 1404  BP: (!) 134/91  Pulse: 99  Temp: 98.8 F (37.1 C)  Weight: 219 lb 4.8 oz (99.5 kg)    HISTORY: OB History  Gravida Para Term Preterm AB Living  5       4    SAB TAB Ectopic Multiple Live Births  3   1        # Outcome Date GA Lbr Len/2nd Weight Sex Delivery Anes PTL Lv  5 Current           4 SAB 2017 [redacted]w[redacted]d         3 SAB 2017 [redacted]w[redacted]d         2 Ectopic 2017 [redacted]w[redacted]d         1 SAB 2017 [redacted]w[redacted]d           Obstetric Comments  One adopted daughter    Past Medical History:  Diagnosis Date  . Kidney stone   . Migraine    Past Surgical History:  Procedure Laterality Date  . KIDNEY STONE SURGERY  2014   L and R    Family History  Problem Relation Age of Onset  . Endometrial cancer Mother   . Breast cancer Maternal Grandmother   . Skin cancer Maternal Grandmother   . Lung cancer Maternal Grandmother   . Stroke Maternal Grandfather   . Endometrial cancer Sister      Exam    Uterus:     Pelvic Exam:    Perineum: Normal Perineum   Vulva: normal   Vagina:  normal mucosa, normal discharge   pH:    Cervix: nulliparous appearance and cervix is closed and long   Adnexa: not evaluated   Bony Pelvis: gynecoid  System: Breast:  normal appearance, no masses or tenderness   Skin: normal coloration and turgor, no rashes    Neurologic: oriented, no focal deficits   Extremities: normal strength, tone, and muscle mass   HEENT extra ocular movement intact   Mouth/Teeth mucous membranes moist, pharynx normal without lesions and dental hygiene good   Neck supple and no masses   Cardiovascular: regular rate and rhythm    Respiratory:  appears well, vitals normal, no respiratory distress, acyanotic, normal RR, chest clear, no wheezing, crepitations, rhonchi, normal symmetric air entry   Abdomen: soft, non-tender; bowel sounds normal; no masses,  no organomegaly   Urinary:       Assessment:    Pregnancy: G5P0040 Patient Active Problem List   Diagnosis Date Noted  . Supervision of high risk pregnancy, antepartum 08/16/2019  . Chronic hypertension affecting pregnancy 08/16/2019  . Recurrent pregnancy loss 04/07/2017        Plan:     Initial labs drawn. Prenatal vitamins. Problem list reviewed and updated. Genetic Screening discussed : panorama ordered  Patient with elevated BP on today's visit and previous visits. Patient reports having white coat syndrome. Baseline labs ordered and Rx ASA provided. Patient informed of diagnosis of CHTN Patient was on prophylactic heparin which has been changed to prophylactic lovenox Patient declined flu vaccine  Ultrasound discussed; fetal survey: ordered.  Follow up in 4 weeks. 50% of 30 min visit spent on  counseling and coordination of care.     [name] 08/16/2019

## 2019-08-16 NOTE — Progress Notes (Signed)
Pt presents for initial NOB visit. Pt of [facility] but she conceived naturally.

## 2019-08-17 LAB — PROTEIN / CREATININE RATIO, URINE
Creatinine, Urine: 78.7 mg/dL
Protein, Ur: 6.3 mg/dL
Protein/Creat Ratio: 80 mg/g creat (ref 0–200)

## 2019-08-17 LAB — CERVICOVAGINAL ANCILLARY ONLY
Chlamydia: NEGATIVE
Neisseria Gonorrhea: NEGATIVE

## 2019-08-19 LAB — CULTURE, OB URINE

## 2019-08-19 LAB — URINE CULTURE, OB REFLEX

## 2019-08-22 DIAGNOSIS — O26899 Other specified pregnancy related conditions, unspecified trimester: Secondary | ICD-10-CM | POA: Insufficient documentation

## 2019-08-22 DIAGNOSIS — Z6791 Unspecified blood type, Rh negative: Secondary | ICD-10-CM | POA: Insufficient documentation

## 2019-08-22 LAB — OBSTETRIC PANEL, INCLUDING HIV
Antibody Screen: NEGATIVE
Basophils Absolute: 0 10*3/uL (ref 0.0–0.2)
Basos: 0 %
EOS (ABSOLUTE): 0.2 10*3/uL (ref 0.0–0.4)
Eos: 2 %
HIV Screen 4th Generation wRfx: NONREACTIVE
Hematocrit: 36 % (ref 34.0–46.6)
Hemoglobin: 12.1 g/dL (ref 11.1–15.9)
Hepatitis B Surface Ag: NEGATIVE
Immature Grans (Abs): 0 10*3/uL (ref 0.0–0.1)
Immature Granulocytes: 1 %
Lymphocytes Absolute: 1.9 10*3/uL (ref 0.7–3.1)
Lymphs: 23 %
MCH: 26.9 pg (ref 26.6–33.0)
MCHC: 33.6 g/dL (ref 31.5–35.7)
MCV: 80 fL (ref 79–97)
Monocytes Absolute: 0.6 10*3/uL (ref 0.1–0.9)
Monocytes: 7 %
Neutrophils Absolute: 5.6 10*3/uL (ref 1.4–7.0)
Neutrophils: 67 %
Platelets: 254 10*3/uL (ref 150–450)
RBC: 4.5 x10E6/uL (ref 3.77–5.28)
RDW: 15.8 % — ABNORMAL HIGH (ref 11.7–15.4)
RPR Ser Ql: NONREACTIVE
Rh Factor: NEGATIVE
Rubella Antibodies, IGG: 3.45 index (ref >0.99)
WBC: 8.3 10*3/uL (ref 3.4–10.8)

## 2019-08-22 LAB — COMPREHENSIVE METABOLIC PANEL
ALT: 41 IU/L — ABNORMAL HIGH (ref 0–32)
AST: 26 IU/L (ref 0–40)
Albumin/Globulin Ratio: 1.5 (ref 1.2–2.2)
Albumin: 4.1 g/dL (ref 3.9–5.0)
Alkaline Phosphatase: 87 IU/L (ref 39–117)
BUN/Creatinine Ratio: 8 — ABNORMAL LOW (ref 9–23)
BUN: 4 mg/dL — ABNORMAL LOW (ref 6–20)
Bilirubin Total: 0.2 mg/dL (ref 0.0–1.2)
CO2: 18 mmol/L — ABNORMAL LOW (ref 20–29)
Calcium: 9.3 mg/dL (ref 8.7–10.2)
Chloride: 106 mmol/L (ref 96–106)
Creatinine, Ser: 0.51 mg/dL — ABNORMAL LOW (ref 0.57–1.00)
GFR calc Af Amer: 149 mL/min/{1.73_m2} (ref >59)
GFR calc non Af Amer: 129 mL/min/{1.73_m2} (ref >59)
Globulin, Total: 2.7 g/dL (ref 1.5–4.5)
Glucose: 87 mg/dL (ref 65–99)
Potassium: 4.1 mmol/L (ref 3.5–5.2)
Sodium: 136 mmol/L (ref 134–144)
Total Protein: 6.8 g/dL (ref 6.0–8.5)

## 2019-08-22 LAB — HEMOGLOBIN A1C
Est. average glucose Bld gHb Est-mCnc: 103 mg/dL
Hgb A1c MFr Bld: 5.2 % (ref 4.8–5.6)

## 2019-08-22 LAB — AFP, SERUM, OPEN SPINA BIFIDA
AFP MoM: 1.12
AFP Value: 26.4 ng/mL
Gest. Age on Collection Date: 15.2 weeks
Maternal Age At EDD: 31.2 yr
OSBR Risk 1 IN: 8371
Test Results:: NEGATIVE
Weight: 219 [lb_av]

## 2019-08-23 ENCOUNTER — Telehealth: Payer: Self-pay

## 2019-08-23 NOTE — Telephone Encounter (Signed)
Patient left message returning Brittany's phone call regarding insufficient fetal DNA. Pt states that is okay to leave a detailed message on vm. Pt informed of results, and to call our office to schedule a re-draw if desired.

## 2019-08-23 NOTE — Telephone Encounter (Signed)
Contacted pt to advised of need for Panorama re-draw due to insufficient fetal DNA, no answer, left vm.

## 2019-08-27 LAB — CYTOLOGY - PAP
Diagnosis: NEGATIVE
High risk HPV: NEGATIVE

## 2019-08-29 ENCOUNTER — Encounter: Payer: Self-pay | Admitting: Obstetrics and Gynecology

## 2019-09-05 ENCOUNTER — Encounter: Payer: BC Managed Care – PPO | Admitting: Obstetrics & Gynecology

## 2019-09-07 ENCOUNTER — Other Ambulatory Visit: Payer: Self-pay

## 2019-09-07 ENCOUNTER — Other Ambulatory Visit: Payer: Self-pay | Admitting: Obstetrics and Gynecology

## 2019-09-07 ENCOUNTER — Other Ambulatory Visit (HOSPITAL_COMMUNITY): Payer: Self-pay | Admitting: *Deleted

## 2019-09-07 ENCOUNTER — Ambulatory Visit (HOSPITAL_COMMUNITY)
Admission: RE | Admit: 2019-09-07 | Discharge: 2019-09-07 | Disposition: A | Discharge status: Home / Self Care | Payer: BC Managed Care – PPO | Source: Ambulatory Visit | Attending: Obstetrics and Gynecology | Admitting: Obstetrics and Gynecology

## 2019-09-07 DIAGNOSIS — O10012 Pre-existing essential hypertension complicating pregnancy, second trimester: Secondary | ICD-10-CM

## 2019-09-07 DIAGNOSIS — O2622 Pregnancy care for patient with recurrent pregnancy loss, second trimester: Secondary | ICD-10-CM

## 2019-09-07 DIAGNOSIS — O99212 Obesity complicating pregnancy, second trimester: Secondary | ICD-10-CM | POA: Diagnosis not present

## 2019-09-07 DIAGNOSIS — Z362 Encounter for other antenatal screening follow-up: Secondary | ICD-10-CM

## 2019-09-07 DIAGNOSIS — O359XX0 Maternal care for (suspected) fetal abnormality and damage, unspecified, not applicable or unspecified: Secondary | ICD-10-CM | POA: Diagnosis not present

## 2019-09-07 DIAGNOSIS — O3412 Maternal care for benign tumor of corpus uteri, second trimester: Secondary | ICD-10-CM

## 2019-09-07 DIAGNOSIS — O0990 Supervision of high risk pregnancy, unspecified, unspecified trimester: Secondary | ICD-10-CM | POA: Insufficient documentation

## 2019-09-07 DIAGNOSIS — Z3A18 18 weeks gestation of pregnancy: Secondary | ICD-10-CM

## 2019-09-13 ENCOUNTER — Encounter: Payer: BC Managed Care – PPO | Admitting: Obstetrics and Gynecology

## 2019-09-18 ENCOUNTER — Ambulatory Visit (INDEPENDENT_AMBULATORY_CARE_PROVIDER_SITE_OTHER): Payer: BC Managed Care – PPO

## 2019-09-18 DIAGNOSIS — O283 Abnormal ultrasonic finding on antenatal screening of mother: Secondary | ICD-10-CM

## 2019-09-18 DIAGNOSIS — O10912 Unspecified pre-existing hypertension complicating pregnancy, second trimester: Secondary | ICD-10-CM

## 2019-09-18 DIAGNOSIS — Z3A20 20 weeks gestation of pregnancy: Secondary | ICD-10-CM

## 2019-09-18 DIAGNOSIS — O0990 Supervision of high risk pregnancy, unspecified, unspecified trimester: Secondary | ICD-10-CM

## 2019-09-18 DIAGNOSIS — O10919 Unspecified pre-existing hypertension complicating pregnancy, unspecified trimester: Secondary | ICD-10-CM

## 2019-09-18 DIAGNOSIS — O0992 Supervision of high risk pregnancy, unspecified, second trimester: Secondary | ICD-10-CM

## 2019-09-18 NOTE — Patient Instructions (Signed)

## 2019-09-18 NOTE — Progress Notes (Signed)
Virtual OB   Pt ordered her own B/P from [place] has not arrived yet.  Pt now taking Lovenox.   CC: None

## 2019-09-18 NOTE — Progress Notes (Signed)
TELEHEALTH OBSTETRICS PRENATAL VIRTUAL VIDEO VISIT ENCOUNTER NOTE  Provider location: [facility] [place]   I connected with Denise [name] on 09/18/19 at  1:30 PM EST by WebEx Encounter at home and verified that I am speaking with the correct person using two identifiers.   I discussed the limitations, risks, security and privacy concerns of performing an evaluation and management service virtually and the availability of in person appointments. I also discussed with the patient that there may be a patient responsible charge related to this service. The patient expressed understanding and agreed to proceed. Subjective:  Denise [name] is a 33 y.o. G5P0040 at [redacted]w[redacted]d being seen today for ongoing prenatal care.  She is currently monitored for the following issues for this high-risk pregnancy and has Recurrent pregnancy loss; Supervision of high risk pregnancy, antepartum; Chronic hypertension affecting pregnancy; and Rh negative status during pregnancy on their problem list.  Patient reports no complaints and expresses excitement of perception of fetal movement experienced in the last week.  Patient with questions regarding [place] results and repeat Panorama testing.  Contractions: Not present. Vag. Bleeding: None.  Movement: Present. Denies any leaking of fluid.   The following portions of the patient's history were reviewed and updated as appropriate: allergies, current medications, past family history, past medical history, past social history, past surgical history and problem list.   Objective:  There were no vitals filed for this visit.  Fetal Status:     Movement: Present     General:  Alert, oriented and cooperative. Patient is in no acute distress.  Respiratory: Normal respiratory effort, no problems with respiration noted  Mental Status: Normal mood and affect. Normal behavior. Normal judgment and thought content.  Rest of physical exam deferred due to type of  encounter  Imaging: [place] Mfm Ob Detail +14 Wk  Result Date: 09/07/2019 ----------------------------------------------------------------------  OBSTETRICS REPORT                       (Signed Final 09/07/2019 05:03 pm) ---------------------------------------------------------------------- Patient Info  ID #:       [id number]                          D.O.B.:  [birth year] (33 yrs)  Name:       Denise [name]                   Visit Date: 09/07/2019 01:51 pm ---------------------------------------------------------------------- Performed By  Performed By:     Tomma Lightning             Ref. Address:     Faculty                    RDMS,RVT  Attending:        Noralee Space MD        [facility]  Referred By:      Gigi Gin                    CONSTANT  MD ---------------------------------------------------------------------- Orders   #  Description                          Code         Ordered By   1  [place] MFM OB DETAIL +14 Sharon              76811.01     PEGGY CONSTANT  ----------------------------------------------------------------------   #  Order #                    Accession #                 Episode #   1  [id number]                  [id number]                  [id number]  ---------------------------------------------------------------------- Indications   Obesity complicating pregnancy, second         O99.212   trimester (Pre Pregnancy BMI 37)   [redacted] weeks gestation of pregnancy                Z3A.18   Encounter for antenatal screening for          Z36.3   malformations   Hypertension - Chronic/Pre-existing (ASA)      O10.019   Poor obstetric history-Recurrent (habitual)    O26.20   abortion (3 consecutive ab's)   Fetal abnormality - other known or             O35.9XX0   suspected (Bilat CPC, EIFLV)   Maternal Clotting Disorder (taking Lovanox)   Uterine fibroids                               O34.10   ---------------------------------------------------------------------- Fetal Evaluation  Num Of Fetuses:         1  Fetal Heart Rate(bpm):  151  Cardiac Activity:       Observed  Presentation:           Breech  Placenta:               Anterior  P. Cord Insertion:      Visualized  Amniotic Fluid  AFI FV:      Within normal limits                              Largest Pocket(cm)                              4.93 ---------------------------------------------------------------------- Biometry  BPD:      43.1  mm     G. Age:  19w 0d         76  %    CI:         81.2   %    70 - 86                                                          FL/HC:      15.5   %  15.8 - 18  HC:       151   mm     G. Age:  18w 1d         28  %    HC/AC:      1.21        1.07 - 1.29  AC:      125.3  mm     G. Age:  18w 1d         37  %    FL/BPD:     54.3   %  FL:       23.4  mm     G. Age:  17w 0d          6  %    FL/AC:      18.7   %    20 - 24  HUM:      22.6  mm     G. Age:  17w 0d          9  %  CER:      16.9  mm     G. Age:  16w 6d         14  %  NFT:       2.3  mm  LV:        7.2  mm  CM:        3.9  mm  Est. FW:     206  gm      0 lb 7 oz     11  % ---------------------------------------------------------------------- OB History  Gravidity:    5          SAB:   3  Ectopic:      1        Living:  0 ---------------------------------------------------------------------- Gestational Age  LMP:           18w 3d        Date:  05/01/19                 EDD:   02/05/20  U/S Today:     18w 1d                                        EDD:   02/07/20  Best:          18w 3d     Det. By:  LMP  (05/01/19)          EDD:   02/05/20 ---------------------------------------------------------------------- Anatomy  Cranium:               Appears normal         LVOT:                   Appears normal  Cavum:                 Appears normal         Aortic Arch:            Not well visualized  Ventricles:            Appears normal         Ductal Arch:             Not well visualized  Choroid Plexus:        Bilateral choroid      Diaphragm:  Appears normal                         plexus cysts  Cerebellum:            Appears normal         Stomach:                Appears normal, left                                                                        sided  Posterior Fossa:       Appears normal         Abdomen:                Appears normal  Nuchal Fold:           Appears normal         Abdominal Wall:         Appears nml (cord                                                                        insert, abd wall)  Face:                  Appears normal         Cord Vessels:           Not well visualized                         (orbits and profile)  Lips:                  Appears normal         Kidneys:                Not well visualized  Palate:                Not well visualized    Bladder:                Appears normal  Thoracic:              Appears normal         Spine:                  Limited views; S                                                                        Spine nwv  Heart:                 Echogenic focus        Upper Extremities:  Appears normal                         in LV  RVOT:                  Not well visualized    Lower Extremities:      Appears normal  Other:  Fetus appears to be a female. Nasal bone visualized. Heels visualized.          Technically difficult due to maternal habitus and fetal position. ---------------------------------------------------------------------- Cervix Uterus Adnexa  Cervix  Length:            3.1  cm.  Normal appearance by transabdominal scan.  Uterus  Single fibroid noted, see table below.  Left Ovary  Size(cm)       2.8  x   1.5    x  1.9       Vol(ml): 4.18  Within normal limits.  Right Ovary  Size(cm)       2.9  x   1.6    x  1.2       Vol(ml): 2.92  Within normal limits. ---------------------------------------------------------------------- Myomas   Site                     L(cm)      W(cm)       D(cm)      Location   LT                       5.1        4.2        4.5  ----------------------------------------------------------------------   Blood Flow                 RI        PI       Comments  ---------------------------------------------------------------------- Impression  Ms. Roda ShuttersKriegel, G5 P0040, is here for fetal anatomy scan.  She  had 3 spontaneous early miscarriages and 1 ectopic  pregnancy.  She also takes Lovenox prophylaxis as  recommended by her fertility specialist.  This is a natural  conception.  I see from her chart that cell free fetal DNA showed no result  the patient is aware of it.  MSAFP screening showed low risk  for open neural tube defects.  We performed a fetal anatomy scan. An echogenic  intracardiac focus is seen. In addition, bilateral choroid plexus  cysts (CPC) are seen. No other markers of aneuploidies or  fetal structural defects are seen. Fetal biometry is consistent  with her previously-established dates. Amniotic fluid is normal  and good fetal activity is seen. Patient understands the  limitations of ultrasound in detecting fetal anomalies.  Maternal obesity imposes limitations on the resolution of  images, and failure to detect fetal anomalies is more common  in obese pregnant women. As maternal obesity makes  clinical assessment of fetal growth difficult, we recommend  serial growth scans until delivery.  I counseled the patient on the following:  Echogenic intracardiac focus: I counseled the patient that  echogenic focus is seen in about 3% to 4% of normal fetuses  (more in Asian population), and in about 15%-20% of fetuses  with Down syndrome. She was reassured that echogenic  focus is not associated with any structural heart  malformations. Presence of this isolated marker only slightly  increases the a priori risk for Down syndrome.  CPC: I counseled the patient  that isolated CPC is only rarely  associated with chromosomal anomaly (trisomy 38). I also  reassured her  that [facility] is not associated with structural  malformations in the brain. CPCs usually resolve with  advancing gestation. Trisomy 18 fetuses usually have major  anomalies that can be detected by ultrasound.  I discussed the significance of "no result" on cell-free fetal  DNA screening. Patient has an option of redrawing sample  for cell-free fetal DNA screening. Repeat screening can show  low risk/increased risk for aneuploidy/no result.  If "no result" is seen on cell-free fetal DNA screening, the  aneuploidy risk is about 2% to 3%. I informed the patient that  my recommendation would be (if "no result" is seen again) to  undergo amniocentesis for a definitive result on the fetal  karyotype. I explained the procedure and possible  complication of miscarriage (1 in 500 procedures). Patient  also has an option of opting for amniocentesis without  undergoing repeat screening.  Patient informed that she would like to discuss with her  husband and decide. ---------------------------------------------------------------------- Recommendations  -Redraw sample for cell-free fetal DNA screening.  -Alternatively, amniocentesis may be performed without  redraw.  -An appointment was made for her to return in 4 weeks for  completion of fetal anatomy. ----------------------------------------------------------------------                  Noralee Space, MD Electronically Signed Final Report   09/07/2019 05:03 pm ----------------------------------------------------------------------   Assessment and Plan:  Pregnancy: G5P0040 at [redacted]w[redacted]d 1. Supervision of high risk pregnancy, antepartum -Anticipatory guidance for upcoming appts given.  -Reviewed genetic testing and given guidelines on what to consider for retesting. -Instructed to discuss repeat with FOB.  2. Chronic hypertension in pregnancy -Patient instructed to call office in BP monitor not received by Friday morning  3. Fetal echogenic intracardiac focus on prenatal  ultrasound -Reviewed US findings including Echogenic cardiac and choroid plexus cysts.  -Educated on what these findings mean and recommendation by MFM for repeating US.    Preterm labor symptoms and general obstetric precautions including but not limited to vaginal bleeding, contractions, leaking of fluid and fetal movement were reviewed in detail with the patient. I discussed the assessment and treatment plan with the patient. The patient was provided an opportunity to ask questions and all were answered. The patient agreed with the plan and demonstrated an understanding of the instructions. The patient was advised to call back or seek an in-person office evaluation/go to [facility] at [facility] for any urgent or concerning symptoms. Please refer to After Visit Summary for other counseling recommendations.   I provided 10 minutes of face-to-face time during this encounter.  Return in about 4 weeks (around 10/16/2019) for LR-ROB via WebEx and 8 weeks in office for GTT.Marland Kitchen  Future Appointments  Date Time Provider Department Center  10/05/2019  2:15 PM WH-MFC [place] 4 WH-MFCUS MFC-US  10/05/2019  2:20 PM WH-MFC NURSE WH-MFC MFC-US    Cherre Robins, CNM [facility], [facility]

## 2019-09-27 ENCOUNTER — Other Ambulatory Visit: Payer: BC Managed Care – PPO

## 2019-09-27 ENCOUNTER — Other Ambulatory Visit: Payer: Self-pay

## 2019-10-05 ENCOUNTER — Ambulatory Visit (HOSPITAL_COMMUNITY)
Admission: RE | Admit: 2019-10-05 | Discharge: 2019-10-05 | Disposition: A | Discharge status: Home / Self Care | Payer: BC Managed Care – PPO | Source: Ambulatory Visit | Attending: Obstetrics and Gynecology | Admitting: Obstetrics and Gynecology

## 2019-10-05 ENCOUNTER — Ambulatory Visit (HOSPITAL_COMMUNITY): Payer: BC Managed Care – PPO | Admitting: *Deleted

## 2019-10-05 ENCOUNTER — Encounter (HOSPITAL_COMMUNITY): Payer: Self-pay

## 2019-10-05 ENCOUNTER — Other Ambulatory Visit (HOSPITAL_COMMUNITY): Payer: Self-pay | Admitting: *Deleted

## 2019-10-05 ENCOUNTER — Other Ambulatory Visit: Payer: Self-pay

## 2019-10-05 DIAGNOSIS — Z362 Encounter for other antenatal screening follow-up: Secondary | ICD-10-CM | POA: Insufficient documentation

## 2019-10-05 DIAGNOSIS — O26892 Other specified pregnancy related conditions, second trimester: Secondary | ICD-10-CM

## 2019-10-05 DIAGNOSIS — O10919 Unspecified pre-existing hypertension complicating pregnancy, unspecified trimester: Secondary | ICD-10-CM

## 2019-10-05 DIAGNOSIS — O10012 Pre-existing essential hypertension complicating pregnancy, second trimester: Secondary | ICD-10-CM | POA: Diagnosis not present

## 2019-10-05 DIAGNOSIS — O3412 Maternal care for benign tumor of corpus uteri, second trimester: Secondary | ICD-10-CM

## 2019-10-05 DIAGNOSIS — Z6791 Unspecified blood type, Rh negative: Secondary | ICD-10-CM

## 2019-10-05 DIAGNOSIS — O2622 Pregnancy care for patient with recurrent pregnancy loss, second trimester: Secondary | ICD-10-CM

## 2019-10-05 DIAGNOSIS — O359XX0 Maternal care for (suspected) fetal abnormality and damage, unspecified, not applicable or unspecified: Secondary | ICD-10-CM

## 2019-10-05 DIAGNOSIS — O99212 Obesity complicating pregnancy, second trimester: Secondary | ICD-10-CM

## 2019-10-05 DIAGNOSIS — Z3A22 22 weeks gestation of pregnancy: Secondary | ICD-10-CM

## 2019-10-15 ENCOUNTER — Encounter: Payer: Self-pay | Admitting: Obstetrics and Gynecology

## 2019-10-16 ENCOUNTER — Encounter: Payer: Self-pay | Admitting: Obstetrics and Gynecology

## 2019-10-16 ENCOUNTER — Telehealth (INDEPENDENT_AMBULATORY_CARE_PROVIDER_SITE_OTHER): Payer: BC Managed Care – PPO | Admitting: Obstetrics and Gynecology

## 2019-10-16 VITALS — BP 133/84 | HR 96 | Wt 219.0 lb

## 2019-10-16 DIAGNOSIS — O0990 Supervision of high risk pregnancy, unspecified, unspecified trimester: Secondary | ICD-10-CM

## 2019-10-16 DIAGNOSIS — O0992 Supervision of high risk pregnancy, unspecified, second trimester: Secondary | ICD-10-CM

## 2019-10-16 DIAGNOSIS — O10912 Unspecified pre-existing hypertension complicating pregnancy, second trimester: Secondary | ICD-10-CM

## 2019-10-16 DIAGNOSIS — N96 Recurrent pregnancy loss: Secondary | ICD-10-CM

## 2019-10-16 DIAGNOSIS — Z3A24 24 weeks gestation of pregnancy: Secondary | ICD-10-CM

## 2019-10-16 DIAGNOSIS — O10919 Unspecified pre-existing hypertension complicating pregnancy, unspecified trimester: Secondary | ICD-10-CM

## 2019-10-16 NOTE — Progress Notes (Signed)
Virtual Visit via Telephone Note  I connected with Denise [name] on 10/16/19 at  1:35 PM EST by telephone and verified that I am speaking with the correct person using two identifiers.  No complaints today per pt

## 2019-10-16 NOTE — Progress Notes (Addendum)
   MY CHART VIDEO VIRTUAL OBSTETRICS VISIT ENCOUNTER NOTE  I connected with Denise [name] on 10/16/19 at  1:35 PM EST by My Chart video at home and verified that I am speaking with the correct person using two identifiers.   I discussed the limitations, risks, security and privacy concerns of performing an evaluation and management service by My Chart video and the availability of in person appointments. I also discussed with the patient that there may be a patient responsible charge related to this service. The patient expressed understanding and agreed to proceed.  Subjective:  Denise [name] is a 33 y.o. [id number] at [redacted]w[redacted]d being followed for ongoing prenatal care.  She is currently monitored for the following issues for this high-risk pregnancy and has Recurrent pregnancy loss; Supervision of high risk pregnancy, antepartum; Chronic hypertension affecting pregnancy; Rh negative status during pregnancy; and Fetal echogenic intracardiac focus on prenatal ultrasound on their problem list.  Patient reports no complaints. Reports fetal movement. Denies any contractions, bleeding or leaking of fluid.   The following portions of the patient's history were reviewed and updated as appropriate: allergies, current medications, past family history, past medical history, past social history, past surgical history and problem list.   Objective:   General:  Alert, oriented and cooperative.   Mental Status: Normal mood and affect perceived. Normal judgment and thought content.  Rest of physical exam deferred due to type of encounter  BP 133/84   Pulse 96   Wt 219 lb (99.3 kg)   LMP 05/01/2019   BMI 37.59 kg/m  **Done by patient's own at home BP cuff and scale  Assessment and Plan:  Pregnancy: [id number] at [redacted]w[redacted]d  1. Supervision of high risk pregnancy, antepartum - Discussed plans for circumcision -- planning to get circ  Advised can be done here at [facility] outpatient up to 1 week of age - Anticipatory  guidance for 2 hr GTT, tdaP & flu vaccine offerings at next visit  2. Chronic hypertension affecting pregnancy - Taking bASA daily  3. Recurrent pregnancy loss - Using Lovenox injections daily   Preterm labor symptoms and general obstetric precautions including but not limited to vaginal bleeding, contractions, leaking of fluid and fetal movement were reviewed in detail with the patient.  I discussed the assessment and treatment plan with the patient. The patient was provided an opportunity to ask questions and all were answered. The patient agreed with the plan and demonstrated an understanding of the instructions. The patient was advised to call back or seek an in-person office evaluation/go to [facility]U at [facility] for any urgent or concerning symptoms. Please refer to After Visit Summary for other counseling recommendations.   I provided 5 minutes of non-face-to-face time during this encounter. There was 5 minutes of chart review time spent prior to this encounter. Total time spent = 10 minutes.  Return in about 4 weeks (around 11/13/2019) for Return OB 2hr GTT.  Future Appointments  Date Time Provider [place]  11/01/2019  2:15 PM [place] NURSE [place] [facility]  11/01/2019  2:15 PM [place] 4 [facility]  11/13/2019  8:30 AM [facility] None  11/13/2019  8:55 AM [place] Danker, Myles Rosenthal, PA-C [facility] None    Laury Deep, [place] for [employer], [place]

## 2019-10-16 NOTE — Patient Instructions (Signed)
Glucose Tolerance Test During Pregnancy Why am I having this test? The glucose tolerance test (GTT) is done to check how your body processes sugar (glucose). This is one of several tests used to diagnose diabetes that develops during pregnancy (gestational diabetes mellitus). Gestational diabetes is a temporary form of diabetes that some women develop during pregnancy. It usually occurs during the second trimester of pregnancy and goes away after delivery. Testing (screening) for gestational diabetes usually occurs between 24 and 28 weeks of pregnancy. You may have the GTT test after having a 1-hour glucose screening test if the results from that test indicate that you may have gestational diabetes. You may also have this test if:  You have a history of gestational diabetes.  You have a history of giving birth to very large babies or have experienced repeated fetal loss (stillbirth).  You have signs and symptoms of diabetes, such as: ? Changes in your vision. ? Tingling or numbness in your hands or feet. ? Changes in hunger, thirst, and urination that are not otherwise explained by your pregnancy. What is being tested? This test measures the amount of glucose in your blood at different times during a period of 3 hours. This indicates how well your body is able to process glucose. What kind of sample is taken?  Blood samples are required for this test. They are usually collected by inserting a needle into a blood vessel. How do I prepare for this test?  For 3 days before your test, eat normally. Have plenty of carbohydrate-rich foods.  Follow instructions from your health care provider about: ? Eating or drinking restrictions on the day of the test. You may be asked to not eat or drink anything other than water (fast) starting 8-10 hours before the test. ? Changing or stopping your regular medicines. Some medicines may interfere with this test. Tell a health care provider about:  All  medicines you are taking, including vitamins, herbs, eye drops, creams, and over-the-counter medicines.  Any blood disorders you have.  Any surgeries you have had.  Any medical conditions you have. What happens during the test? First, your blood glucose will be measured. This is referred to as your fasting blood glucose, since you fasted before the test. Then, you will drink a glucose solution that contains a certain amount of glucose. Your blood glucose will be measured again 1, 2, and 3 hours after drinking the solution. This test takes about 3 hours to complete. You will need to stay at the testing location during this time. During the testing period:  Do not eat or drink anything other than the glucose solution.  Do not exercise.  Do not use any products that contain nicotine or tobacco, such as cigarettes and e-cigarettes. If you need help stopping, ask your health care provider. The testing procedure may vary among health care providers and hospitals. How are the results reported? Your results will be reported as milligrams of glucose per deciliter of blood (mg/dL) or millimoles per liter (mmol/L). Your health care provider will compare your results to normal ranges that were established after testing a large group of people (reference ranges). Reference ranges may vary among labs and hospitals. For this test, common reference ranges are:  Fasting: less than 95-105 mg/dL (5.3-5.8 mmol/L).  1 hour after drinking glucose: less than 180-190 mg/dL (10.0-10.5 mmol/L).  2 hours after drinking glucose: less than 155-165 mg/dL (8.6-9.2 mmol/L).  3 hours after drinking glucose: 140-145 mg/dL (7.8-8.1 mmol/L). What do the   results mean? Results within reference ranges are considered normal, meaning that your glucose levels are well-controlled. If two or more of your blood glucose levels are high, you may be diagnosed with gestational diabetes. If only one level is high, your health care  provider may suggest repeat testing or other tests to confirm a diagnosis. Talk with your health care provider about what your results mean. Questions to ask your health care provider Ask your health care provider, or the department that is doing the test:  When will my results be ready?  How will I get my results?  What are my treatment options?  What other tests do I need?  What are my next steps? Summary  The glucose tolerance test (GTT) is one of several tests used to diagnose diabetes that develops during pregnancy (gestational diabetes mellitus). Gestational diabetes is a temporary form of diabetes that some women develop during pregnancy.  You may have the GTT test after having a 1-hour glucose screening test if the results from that test indicate that you may have gestational diabetes. You may also have this test if you have any symptoms or risk factors for gestational diabetes.  Talk with your health care provider about what your results mean. This information is not intended to replace advice given to you by your health care provider. Make sure you discuss any questions you have with your health care provider. Document Released: 05/02/2012 Document Revised: 02/22/2019 Document Reviewed: 06/13/2017 Elsevier Patient Education  2020 Elsevier Inc.  

## 2019-10-18 ENCOUNTER — Encounter: Payer: BC Managed Care – PPO | Admitting: Certified Nurse Midwife

## 2019-11-01 ENCOUNTER — Ambulatory Visit (HOSPITAL_COMMUNITY): Payer: BC Managed Care – PPO | Admitting: *Deleted

## 2019-11-01 ENCOUNTER — Ambulatory Visit (HOSPITAL_COMMUNITY)
Admission: RE | Admit: 2019-11-01 | Discharge: 2019-11-01 | Disposition: A | Discharge status: Home / Self Care | Payer: BC Managed Care – PPO | Source: Ambulatory Visit | Attending: Obstetrics and Gynecology | Admitting: Obstetrics and Gynecology

## 2019-11-01 ENCOUNTER — Other Ambulatory Visit: Payer: Self-pay

## 2019-11-01 ENCOUNTER — Encounter (HOSPITAL_COMMUNITY): Payer: Self-pay

## 2019-11-01 ENCOUNTER — Other Ambulatory Visit (HOSPITAL_COMMUNITY): Payer: Self-pay | Admitting: Obstetrics and Gynecology

## 2019-11-01 DIAGNOSIS — Z362 Encounter for other antenatal screening follow-up: Secondary | ICD-10-CM

## 2019-11-01 DIAGNOSIS — O2622 Pregnancy care for patient with recurrent pregnancy loss, second trimester: Secondary | ICD-10-CM | POA: Diagnosis not present

## 2019-11-01 DIAGNOSIS — O365920 Maternal care for other known or suspected poor fetal growth, second trimester, not applicable or unspecified: Secondary | ICD-10-CM

## 2019-11-01 DIAGNOSIS — Z6791 Unspecified blood type, Rh negative: Secondary | ICD-10-CM | POA: Diagnosis present

## 2019-11-01 DIAGNOSIS — O359XX0 Maternal care for (suspected) fetal abnormality and damage, unspecified, not applicable or unspecified: Secondary | ICD-10-CM | POA: Diagnosis not present

## 2019-11-01 DIAGNOSIS — O10919 Unspecified pre-existing hypertension complicating pregnancy, unspecified trimester: Secondary | ICD-10-CM

## 2019-11-01 DIAGNOSIS — O3412 Maternal care for benign tumor of corpus uteri, second trimester: Secondary | ICD-10-CM

## 2019-11-01 DIAGNOSIS — O99212 Obesity complicating pregnancy, second trimester: Secondary | ICD-10-CM | POA: Diagnosis not present

## 2019-11-01 DIAGNOSIS — Z3A26 26 weeks gestation of pregnancy: Secondary | ICD-10-CM

## 2019-11-01 DIAGNOSIS — O26892 Other specified pregnancy related conditions, second trimester: Secondary | ICD-10-CM | POA: Insufficient documentation

## 2019-11-01 DIAGNOSIS — O10012 Pre-existing essential hypertension complicating pregnancy, second trimester: Secondary | ICD-10-CM

## 2019-11-02 ENCOUNTER — Other Ambulatory Visit (HOSPITAL_COMMUNITY): Payer: Self-pay | Admitting: *Deleted

## 2019-11-02 DIAGNOSIS — O365930 Maternal care for other known or suspected poor fetal growth, third trimester, not applicable or unspecified: Secondary | ICD-10-CM

## 2019-11-13 ENCOUNTER — Encounter: Payer: Self-pay | Admitting: Medical

## 2019-11-13 ENCOUNTER — Other Ambulatory Visit: Payer: BC Managed Care – PPO

## 2019-11-13 ENCOUNTER — Other Ambulatory Visit: Payer: Self-pay

## 2019-11-13 ENCOUNTER — Ambulatory Visit (INDEPENDENT_AMBULATORY_CARE_PROVIDER_SITE_OTHER): Payer: BC Managed Care – PPO | Admitting: Medical

## 2019-11-13 VITALS — BP 124/88 | HR 88 | Wt 227.5 lb

## 2019-11-13 DIAGNOSIS — O365930 Maternal care for other known or suspected poor fetal growth, third trimester, not applicable or unspecified: Secondary | ICD-10-CM

## 2019-11-13 DIAGNOSIS — N96 Recurrent pregnancy loss: Secondary | ICD-10-CM

## 2019-11-13 DIAGNOSIS — O360130 Maternal care for anti-D [Rh] antibodies, third trimester, not applicable or unspecified: Secondary | ICD-10-CM

## 2019-11-13 DIAGNOSIS — Z23 Encounter for immunization: Secondary | ICD-10-CM | POA: Diagnosis not present

## 2019-11-13 DIAGNOSIS — O0993 Supervision of high risk pregnancy, unspecified, third trimester: Secondary | ICD-10-CM

## 2019-11-13 DIAGNOSIS — O0990 Supervision of high risk pregnancy, unspecified, unspecified trimester: Secondary | ICD-10-CM

## 2019-11-13 DIAGNOSIS — Z3A28 28 weeks gestation of pregnancy: Secondary | ICD-10-CM | POA: Diagnosis not present

## 2019-11-13 DIAGNOSIS — Z6791 Unspecified blood type, Rh negative: Secondary | ICD-10-CM

## 2019-11-13 DIAGNOSIS — O10913 Unspecified pre-existing hypertension complicating pregnancy, third trimester: Secondary | ICD-10-CM

## 2019-11-13 DIAGNOSIS — O283 Abnormal ultrasonic finding on antenatal screening of mother: Secondary | ICD-10-CM

## 2019-11-13 DIAGNOSIS — O10919 Unspecified pre-existing hypertension complicating pregnancy, unspecified trimester: Secondary | ICD-10-CM

## 2019-11-13 DIAGNOSIS — O26893 Other specified pregnancy related conditions, third trimester: Secondary | ICD-10-CM

## 2019-11-13 LAB — POCT URINALYSIS DIPSTICK
Glucose, UA: NEGATIVE
Protein, UA: NEGATIVE

## 2019-11-13 MED ORDER — RHO D IMMUNE GLOBULIN 1500 UNIT/2ML IJ SOSY
300.0000 ug | PREFILLED_SYRINGE | Freq: Once | INTRAMUSCULAR | Status: AC
  Administered 2019-11-13: 300 ug via INTRAMUSCULAR

## 2019-11-13 NOTE — Progress Notes (Signed)
HOB/GTT. TDAP and RHO given, tolerated well.  Reports no problems today.Marland Kitchen

## 2019-11-13 NOTE — Progress Notes (Signed)
   PRENATAL VISIT NOTE  Subjective:  Denise [name] is a 33 y.o. [id number] at [redacted]w[redacted]d being seen today for ongoing prenatal care.  She is currently monitored for the following issues for this high-risk pregnancy and has Recurrent pregnancy loss; Supervision of high risk pregnancy, antepartum; Chronic hypertension affecting pregnancy; Rh negative status during pregnancy; and Fetal echogenic intracardiac focus on prenatal ultrasound on their problem list.  Patient reports no complaints.  Contractions: Not present. Vag. Bleeding: None.  Movement: Present. Denies leaking of fluid.   The following portions of the patient's history were reviewed and updated as appropriate: allergies, current medications, past family history, past medical history, past social history, past surgical history and problem list.   Objective:   Vitals:   11/13/19 0824  BP: 124/88  Pulse: 88  Weight: 227 lb 8 oz (103.2 kg)    Fetal Status: Fetal Heart Rate (bpm): 154   Movement: Present     General:  Alert, oriented and cooperative. Patient is in no acute distress.  Skin: Skin is warm and dry. No rash noted.   Cardiovascular: Normal heart rate noted  Respiratory: Normal respiratory effort, no problems with respiration noted  Abdomen: Soft, gravid, appropriate for gestational age.  Pain/Pressure: Absent     Pelvic: Cervical exam deferred        Extremities: Normal range of motion.  Edema: None  Mental Status: Normal mood and affect. Normal behavior. Normal judgment and thought content.   Assessment and Plan:  Pregnancy: [id number] at [redacted]w[redacted]d 1. Supervision of high risk pregnancy, antepartum - Glucose Tolerance, 2 Hours w/1 Hour - CBC - HIV antibody (with reflex) - RPR - Tdap vaccine greater than or equal to 7yo IM - POCT Urinalysis Dipstick - Discussed plan for MOC PP, patient states planning to use condoms   2. Rh negative status during pregnancy in third trimester - rho (d) immune globulin (RHIG/RHOPHYLAC)  injection 300 mcg  3. Recurrent pregnancy loss - On PPX Lovenox   4. Chronic hypertension affecting pregnancy - Taking ASA, no anti-hypertension meds currently  - Normotensive today and at home regularly per patient report   5. Fetal echogenic intracardiac focus on prenatal ultrasound  6. Poor fetal growth affecting management of mother in third trimester, single or unspecified fetus - Last US showed EFW 9%  - Follow-up scheduled 11/22/19  Preterm labor symptoms and general obstetric precautions including but not limited to vaginal bleeding, contractions, leaking of fluid and fetal movement were reviewed in detail with the patient. Please refer to After Visit Summary for other counseling recommendations.   Return in about 2 weeks (around 11/27/2019) for [facility], Virtual.  Future Appointments  Date Time Provider [place]  11/15/2019 10:45 AM [place] [facility]-US  11/15/2019 10:45 AM [facility] [place] 4 [facility] M[facility]-US  11/22/2019  3:15 PM [place] [facility]  11/22/2019  3:15 PM [place] 4 [facility]-US  11/27/2019  3:45 PM [place], Vickii Chafe, MD [facility] None    Kerry Hough, PA-C

## 2019-11-13 NOTE — Patient Instructions (Signed)
Fetal Movement Counts Patient Name: ________________________________________________ Patient Due Date: ____________________ What is a fetal movement count?  A fetal movement count is the number of times that you feel your baby move during a certain amount of time. This may also be called a fetal kick count. A fetal movement count is recommended for every pregnant woman. You may be asked to start counting fetal movements as early as week 28 of your pregnancy. Pay attention to when your baby is most active. You may notice your baby's sleep and wake cycles. You may also notice things that make your baby move more. You should do a fetal movement count:  When your baby is normally most active.  At the same time each day. A good time to count movements is while you are resting, after having something to eat and drink. How do I count fetal movements? 1. Find a quiet, comfortable area. Sit, or lie down on your side. 2. Write down the date, the start time and stop time, and the number of movements that you felt between those two times. Take this information with you to your health care visits. 3. For 2 hours, count kicks, flutters, swishes, rolls, and jabs. You should feel at least 10 movements during 2 hours. 4. You may stop counting after you have felt 10 movements. 5. If you do not feel 10 movements in 2 hours, have something to eat and drink. Then, keep resting and counting for 1 hour. If you feel at least 4 movements during that hour, you may stop counting. Contact a health care provider if:  You feel fewer than 4 movements in 2 hours.  Your baby is not moving like he or she usually does. Date: ____________ Start time: ____________ Stop time: ____________ Movements: ____________ Date: ____________ Start time: ____________ Stop time: ____________ Movements: ____________ Date: ____________ Start time: ____________ Stop time: ____________ Movements: ____________ Date: ____________ Start time:  ____________ Stop time: ____________ Movements: ____________ Date: ____________ Start time: ____________ Stop time: ____________ Movements: ____________ Date: ____________ Start time: ____________ Stop time: ____________ Movements: ____________ Date: ____________ Start time: ____________ Stop time: ____________ Movements: ____________ Date: ____________ Start time: ____________ Stop time: ____________ Movements: ____________ Date: ____________ Start time: ____________ Stop time: ____________ Movements: ____________ This information is not intended to replace advice given to you by your health care provider. Make sure you discuss any questions you have with your health care provider. Document Released: 12/01/2006 Document Revised: 11/21/2018 Document Reviewed: 12/11/2015 Elsevier Patient Education  2020 Elsevier Inc. Braxton Hicks Contractions Contractions of the uterus can occur throughout pregnancy, but they are not always a sign that you are in labor. You may have practice contractions called Braxton Hicks contractions. These false labor contractions are sometimes confused with true labor. What are Braxton Hicks contractions? Braxton Hicks contractions are tightening movements that occur in the muscles of the uterus before labor. Unlike true labor contractions, these contractions do not result in opening (dilation) and thinning of the cervix. Toward the end of pregnancy (32-34 weeks), Braxton Hicks contractions can happen more often and may become stronger. These contractions are sometimes difficult to tell apart from true labor because they can be very uncomfortable. You should not feel embarrassed if you go to the hospital with false labor. Sometimes, the only way to tell if you are in true labor is for your health care provider to look for changes in the cervix. The health care provider will do a physical exam and may monitor your contractions. If you   are not in true labor, the exam should show  that your cervix is not dilating and your water has not broken. If there are no other health problems associated with your pregnancy, it is completely safe for you to be sent home with false labor. You may continue to have Braxton Hicks contractions until you go into true labor. How to tell the difference between true labor and false labor True labor  Contractions last 30-70 seconds.  Contractions become very regular.  Discomfort is usually felt in the top of the uterus, and it spreads to the lower abdomen and low back.  Contractions do not go away with walking.  Contractions usually become more intense and increase in frequency.  The cervix dilates and gets thinner. False labor  Contractions are usually shorter and not as strong as true labor contractions.  Contractions are usually irregular.  Contractions are often felt in the front of the lower abdomen and in the groin.  Contractions may go away when you walk around or change positions while lying down.  Contractions get weaker and are shorter-lasting as time goes on.  The cervix usually does not dilate or become thin. Follow these instructions at home:   Take over-the-counter and prescription medicines only as told by your health care provider.  Keep up with your usual exercises and follow other instructions from your health care provider.  Eat and drink lightly if you think you are going into labor.  If Braxton Hicks contractions are making you uncomfortable: ? Change your position from lying down or resting to walking, or change from walking to resting. ? Sit and rest in a tub of warm water. ? Drink enough fluid to keep your urine pale yellow. Dehydration may cause these contractions. ? Do slow and deep breathing several times an hour.  Keep all follow-up prenatal visits as told by your health care provider. This is important. Contact a health care provider if:  You have a fever.  You have continuous pain in  your abdomen. Get help right away if:  Your contractions become stronger, more regular, and closer together.  You have fluid leaking or gushing from your vagina.  You pass blood-tinged mucus (bloody show).  You have bleeding from your vagina.  You have low back pain that you never had before.  You feel your baby's head pushing down and causing pelvic pressure.  Your baby is not moving inside you as much as it used to. Summary  Contractions that occur before labor are called Braxton Hicks contractions, false labor, or practice contractions.  Braxton Hicks contractions are usually shorter, weaker, farther apart, and less regular than true labor contractions. True labor contractions usually become progressively stronger and regular, and they become more frequent.  Manage discomfort from Braxton Hicks contractions by changing position, resting in a warm bath, drinking plenty of water, or practicing deep breathing. This information is not intended to replace advice given to you by your health care provider. Make sure you discuss any questions you have with your health care provider. Document Released: 03/17/2017 Document Revised: 10/14/2017 Document Reviewed: 03/17/2017 Elsevier Patient Education  2020 Elsevier Inc.  

## 2019-11-14 LAB — CBC
Hematocrit: 35.2 % (ref 34.0–46.6)
Hemoglobin: 11.9 g/dL (ref 11.1–15.9)
MCH: 28.4 pg (ref 26.6–33.0)
MCHC: 33.8 g/dL (ref 31.5–35.7)
MCV: 84 fL (ref 79–97)
Platelets: 239 10*3/uL (ref 150–450)
RBC: 4.19 x10E6/uL (ref 3.77–5.28)
RDW: 14 % (ref 11.7–15.4)
WBC: 9 10*3/uL (ref 3.4–10.8)

## 2019-11-14 LAB — RPR: RPR Ser Ql: NONREACTIVE

## 2019-11-14 LAB — HIV ANTIBODY (ROUTINE TESTING W REFLEX): HIV Screen 4th Generation wRfx: NONREACTIVE

## 2019-11-14 LAB — GLUCOSE TOLERANCE, 2 HOURS W/ 1HR
Glucose, 1 hour: 140 mg/dL (ref 65–179)
Glucose, 2 hour: 97 mg/dL (ref 65–152)
Glucose, Fasting: 91 mg/dL (ref 65–91)

## 2019-11-15 ENCOUNTER — Encounter (HOSPITAL_COMMUNITY): Payer: Self-pay

## 2019-11-15 ENCOUNTER — Ambulatory Visit (HOSPITAL_COMMUNITY)
Admission: RE | Admit: 2019-11-15 | Discharge: 2019-11-15 | Disposition: A | Discharge status: Home / Self Care | Payer: BC Managed Care – PPO | Source: Ambulatory Visit | Attending: Obstetrics and Gynecology | Admitting: Obstetrics and Gynecology

## 2019-11-15 ENCOUNTER — Ambulatory Visit (HOSPITAL_COMMUNITY): Payer: BC Managed Care – PPO | Admitting: *Deleted

## 2019-11-15 ENCOUNTER — Other Ambulatory Visit: Payer: Self-pay

## 2019-11-15 DIAGNOSIS — O365930 Maternal care for other known or suspected poor fetal growth, third trimester, not applicable or unspecified: Secondary | ICD-10-CM | POA: Diagnosis not present

## 2019-11-15 DIAGNOSIS — O10013 Pre-existing essential hypertension complicating pregnancy, third trimester: Secondary | ICD-10-CM | POA: Diagnosis not present

## 2019-11-15 DIAGNOSIS — O359XX0 Maternal care for (suspected) fetal abnormality and damage, unspecified, not applicable or unspecified: Secondary | ICD-10-CM

## 2019-11-15 DIAGNOSIS — O99213 Obesity complicating pregnancy, third trimester: Secondary | ICD-10-CM | POA: Diagnosis not present

## 2019-11-15 DIAGNOSIS — O2623 Pregnancy care for patient with recurrent pregnancy loss, third trimester: Secondary | ICD-10-CM

## 2019-11-15 DIAGNOSIS — O365931 Maternal care for other known or suspected poor fetal growth, third trimester, fetus 1: Secondary | ICD-10-CM | POA: Diagnosis not present

## 2019-11-15 DIAGNOSIS — O10919 Unspecified pre-existing hypertension complicating pregnancy, unspecified trimester: Secondary | ICD-10-CM | POA: Insufficient documentation

## 2019-11-15 DIAGNOSIS — Z3A28 28 weeks gestation of pregnancy: Secondary | ICD-10-CM

## 2019-11-15 DIAGNOSIS — O3413 Maternal care for benign tumor of corpus uteri, third trimester: Secondary | ICD-10-CM

## 2019-11-22 ENCOUNTER — Encounter (HOSPITAL_COMMUNITY): Payer: Self-pay

## 2019-11-22 ENCOUNTER — Ambulatory Visit (HOSPITAL_COMMUNITY): Payer: BC Managed Care – PPO | Admitting: *Deleted

## 2019-11-22 ENCOUNTER — Ambulatory Visit (HOSPITAL_COMMUNITY)
Admission: RE | Admit: 2019-11-22 | Discharge: 2019-11-22 | Disposition: A | Discharge status: Home / Self Care | Payer: BC Managed Care – PPO | Source: Ambulatory Visit | Attending: Obstetrics and Gynecology | Admitting: Obstetrics and Gynecology

## 2019-11-22 ENCOUNTER — Other Ambulatory Visit: Payer: Self-pay

## 2019-11-22 DIAGNOSIS — Z362 Encounter for other antenatal screening follow-up: Secondary | ICD-10-CM

## 2019-11-22 DIAGNOSIS — O359XX0 Maternal care for (suspected) fetal abnormality and damage, unspecified, not applicable or unspecified: Secondary | ICD-10-CM

## 2019-11-22 DIAGNOSIS — O10013 Pre-existing essential hypertension complicating pregnancy, third trimester: Secondary | ICD-10-CM

## 2019-11-22 DIAGNOSIS — O2623 Pregnancy care for patient with recurrent pregnancy loss, third trimester: Secondary | ICD-10-CM | POA: Diagnosis not present

## 2019-11-22 DIAGNOSIS — O10919 Unspecified pre-existing hypertension complicating pregnancy, unspecified trimester: Secondary | ICD-10-CM | POA: Insufficient documentation

## 2019-11-22 DIAGNOSIS — O365930 Maternal care for other known or suspected poor fetal growth, third trimester, not applicable or unspecified: Secondary | ICD-10-CM | POA: Diagnosis present

## 2019-11-22 DIAGNOSIS — O99213 Obesity complicating pregnancy, third trimester: Secondary | ICD-10-CM | POA: Diagnosis not present

## 2019-11-22 DIAGNOSIS — Z3A29 29 weeks gestation of pregnancy: Secondary | ICD-10-CM

## 2019-11-23 ENCOUNTER — Other Ambulatory Visit (HOSPITAL_COMMUNITY): Payer: Self-pay | Admitting: *Deleted

## 2019-11-27 ENCOUNTER — Telehealth (INDEPENDENT_AMBULATORY_CARE_PROVIDER_SITE_OTHER): Payer: BC Managed Care – PPO | Admitting: Obstetrics and Gynecology

## 2019-11-27 ENCOUNTER — Encounter: Payer: Self-pay | Admitting: Obstetrics and Gynecology

## 2019-11-27 VITALS — BP 123/81 | HR 94

## 2019-11-27 DIAGNOSIS — O0990 Supervision of high risk pregnancy, unspecified, unspecified trimester: Secondary | ICD-10-CM

## 2019-11-27 DIAGNOSIS — N96 Recurrent pregnancy loss: Secondary | ICD-10-CM

## 2019-11-27 DIAGNOSIS — O360130 Maternal care for anti-D [Rh] antibodies, third trimester, not applicable or unspecified: Secondary | ICD-10-CM

## 2019-11-27 DIAGNOSIS — O0993 Supervision of high risk pregnancy, unspecified, third trimester: Secondary | ICD-10-CM

## 2019-11-27 DIAGNOSIS — Z3A30 30 weeks gestation of pregnancy: Secondary | ICD-10-CM

## 2019-11-27 DIAGNOSIS — O10919 Unspecified pre-existing hypertension complicating pregnancy, unspecified trimester: Secondary | ICD-10-CM

## 2019-11-27 DIAGNOSIS — O26893 Other specified pregnancy related conditions, third trimester: Secondary | ICD-10-CM

## 2019-11-27 DIAGNOSIS — O10913 Unspecified pre-existing hypertension complicating pregnancy, third trimester: Secondary | ICD-10-CM

## 2019-11-27 DIAGNOSIS — Z6791 Unspecified blood type, Rh negative: Secondary | ICD-10-CM

## 2019-11-27 NOTE — Progress Notes (Signed)
Pt could not take BP at time of intake today.

## 2019-11-27 NOTE — Progress Notes (Signed)
TELEHEALTH OBSTETRICS PRENATAL VIRTUAL VIDEO VISIT ENCOUNTER NOTE  Provider location: [facility]   I connected with Denise [name] on 11/27/19 at  3:45 PM EST by MyChart Video Encounter at home and verified that I am speaking with the correct person using two identifiers.   I discussed the limitations, risks, security and privacy concerns of performing an evaluation and management service virtually and the availability of in person appointments. I also discussed with the patient that there may be a patient responsible charge related to this service. The patient expressed understanding and agreed to proceed. Subjective:  Denise [name] is a 34 y.o. G5P0040 at 2563w0d being seen today for ongoing prenatal care.  She is currently monitored for the following issues for this high-risk pregnancy and has Recurrent pregnancy loss; Supervision of high risk pregnancy, antepartum; Chronic hypertension affecting pregnancy; Rh negative status during pregnancy; and Fetal echogenic intracardiac focus on prenatal ultrasound on their problem list.  Patient reports no complaints.  Contractions: Not present. Vag. Bleeding: None.  Movement: Present. Denies any leaking of fluid.   The following portions of the patient's history were reviewed and updated as appropriate: allergies, current medications, past family history, past medical history, past social history, past surgical history and problem list.   Objective:   Vitals:   11/27/19 1452  BP: 123/81  Pulse: 94    Fetal Status:     Movement: Present     General:  Alert, oriented and cooperative. Patient is in no acute distress.  Respiratory: Normal respiratory effort, no problems with respiration noted  Mental Status: Normal mood and affect. Normal behavior. Normal judgment and thought content.  Rest of physical exam deferred due to type of encounter  Imaging: US MFM OB FOLLOW UP  Result Date:  11/22/2019 ----------------------------------------------------------------------  OBSTETRICS REPORT                       (Signed Final 11/22/2019 04:52 pm) ---------------------------------------------------------------------- Patient Info  ID #:       [id number]                          D.O.B.:  02/13/1986 (34 yrs)  Name:       Denise [name]                   Visit Date: 11/22/2019 03:28 pm ---------------------------------------------------------------------- Performed By  Performed By:     Eden Lathearrie Stalter BS      Ref. Address:     Faculty                    RDMS RVT  Attending:        Noralee Spaceavi Shankar MD        [facility]  Referred By:      Catalina AntiguaPEGGY                    Taichi Repka MD ---------------------------------------------------------------------- Orders   #  Description  Code         Ordered By   1  [place] MFM UA CORD DOPPLER               [id number]     RAVI SHANKAR   2  [place] MFM OB FOLLOW UP                  [id number]     RAVI SHANKAR  ----------------------------------------------------------------------   #  Order #                    Accession #                 Episode #   1  [id number]                  [id number]                  [id number]   2  [id number]                  [id number]                  [id number]  ---------------------------------------------------------------------- Indications   Maternal care for known or suspected poor      O36.5930   fetal growth, third trimester, not applicable or   unspecified IUGR   Obesity complicating pregnancy, third          O99.213   trimester  (Pre Pregnancy BMI 37)   Encounter for other antenatal screening        Z36.2   follow-up (low risk NIPS)   Hypertension - Chronic/Pre-existing (ASA)      O10.019   Poor obstetric history-Recurrent (habitual)    O26.20   abortion (3 consecutive ab's)   Fetal abnormality - other known or             O35.9XX0   suspected  (Bilat CPC, EIFLV)   Maternal Clotting Disorder (taking Lovanox)   Uterine fibroids                               O34.10   [redacted] weeks gestation of pregnancy                Z3A.29  ---------------------------------------------------------------------- Vital Signs                                                 Height:        5'4" ---------------------------------------------------------------------- Fetal Evaluation  Num Of Fetuses:         1  Fetal Heart Rate(bpm):  150  Cardiac Activity:       Observed  Presentation:           Cephalic  Placenta:               Anterior Fundal  P. Cord Insertion:      Visualized  Amniotic Fluid  AFI FV:      Within normal limits  AFI Sum(cm)     %Tile       Largest Pocket(cm)  12.3            32          3.9  RUQ(cm)       RLQ(cm)  LUQ(cm)        LLQ(cm)  3.42          3.58          3.9            1.4 ---------------------------------------------------------------------- Biometry  BPD:      80.2  mm     G. Age:  32w 1d         98  %    CI:         81.4   %    70 - 86                                                          FL/HC:      17.8   %    19.6 - 20.8  HC:      280.6  mm     G. Age:  30w 5d         60  %    HC/AC:      1.21        0.99 - 1.21  AC:      232.8  mm     G. Age:  27w 4d          7  %    FL/BPD:     62.3   %    71 - 87  FL:         50  mm     G. Age:  26w 6d          1  %    FL/AC:      21.5   %    20 - 24  HUM:      45.9  mm     G. Age:  27w 0d        < 5  %  Est. FW:    1150  gm      2 lb 9 oz      6  % ---------------------------------------------------------------------- OB History  Gravidity:    5          SAB:   3  Ectopic:      1        Living:  0 ---------------------------------------------------------------------- Gestational Age  LMP:           29w 2d        Date:  05/01/19                 EDD:   02/05/20  U/S Today:     29w 2d                                        EDD:   02/05/20  Best:          29w 2d     Det. By:  LMP  (05/01/19)          EDD:    02/05/20 ---------------------------------------------------------------------- Anatomy  Cavum:                 Appears normal         Aortic Arch:            Previously seen  Ventricles:            Appears normal         Ductal Arch:            Not well visualized  Choroid Plexus:        Resolved CPCs          Diaphragm:              Appears normal  Cerebellum:            Appears normal         Stomach:                Appears normal, left                                                                        sided  Posterior Fossa:       Previously seen        Abdomen:                Appears normal  Nuchal Fold:           Previously seen        Abdominal Wall:         Previously seen  Face:                  Appears normal         Cord Vessels:           Previously seen                         (orbits and profile)  Lips:                  Appears normal         Kidneys:                Appear normal  Palate:                Not well visualized    Bladder:                Appears normal  Thoracic:              Appears normal         Spine:                  Previously seen  Heart:                 Echogenic focus        Upper Extremities:      Previously seen                         previously seen  RVOT:                  Previously seen        Lower Extremities:      Previously seen  LVOT:                  Previously seen  Other:  Nasal bone visualized. Heels previously seen. Technically difficult  due to maternal habitus and fetal position. ---------------------------------------------------------------------- Doppler - Fetal Vessels  Umbilical Artery   S/D     %tile                                            ADFV    RDFV  4.42       97                                                No      No ---------------------------------------------------------------------- Cervix Uterus Adnexa  Cervix  Not visualized (advanced GA >24wks)  Uterus  Single fibroid previously noted.  Left Ovary  Not visualized.  Right Ovary   Not visualized.  Cul De Sac  No free fluid seen.  Adnexa  No abnormality visualized. ---------------------------------------------------------------------- Impression  Fetal growth restriction.  The estimated fetal weight is at the 6 percentile (interval  weight gain 359 g).  Amniotic fluid is normal good fetal activity  seen.  Umbilical artery Doppler showed increased S/D ratio.  I discussed the significance of Doppler studies and antenatal  testing. ---------------------------------------------------------------------- Recommendations  -UA Doppler and NST next week. ----------------------------------------------------------------------                  Noralee Space, MD Electronically Signed Final Report   11/22/2019 04:52 pm ----------------------------------------------------------------------  [place] MFM OB FOLLOW UP  Result Date: 11/02/2019 ----------------------------------------------------------------------  OBSTETRICS REPORT                    (Corrected Final 11/02/2019 04:21 pm) ---------------------------------------------------------------------- Patient Info  ID #:       [id number]                          D.O.B.:  September 28, 1986 (33 yrs)  Name:       Denise [name]                   Visit Date: 11/01/2019 02:11 pm ---------------------------------------------------------------------- Performed By  Performed By:     Lenise Arena        Ref. Address:     Faculty                    RDMS  Attending:        Noralee Space MD        [facility]  Referred By:      Catalina Antigua MD ---------------------------------------------------------------------- Orders   #  Description                          Code         Ordered By  1  [place] MFM OB FOLLOW UP                  [id number]     RAVI SHANKAR   2  [place] MFM UA CORD DOPPLER               16109.60     [facility]   ----------------------------------------------------------------------   #  Order #                    Accession #                 Episode #   1  [id number]                  [id number]                  [id number]   2  [id number]                  [id number]                  [id number]  ---------------------------------------------------------------------- Indications   Obesity complicating pregnancy, second         O99.212   trimester (Pre Pregnancy BMI 37)   Encounter for other antenatal screening        Z36.2   follow-up (low risk NIPS)   Hypertension - Chronic/Pre-existing (ASA)      O10.019   [redacted] weeks gestation of pregnancy                Z3A.26   Poor obstetric history-Recurrent (habitual)    O26.20   abortion (3 consecutive ab's)   Fetal abnormality - other known or             O35.9XX0   suspected (Bilat CPC, EIFLV)   Maternal Clotting Disorder (taking Lovanox)   Uterine fibroids                               O34.10   Maternal care for known or suspected poor      O36.5920   fetal growth, second trimester, not applicable   or unspecified IUGR  ---------------------------------------------------------------------- Vital Signs  Weight (lb): 219                               Height:        5'4"  BMI:         37.59 ---------------------------------------------------------------------- Fetal Evaluation  Num Of Fetuses:         1  Cardiac Activity:       Observed  Presentation:           Cephalic  Placenta:               Anterior Fundal  P. Cord Insertion:      Previously Visualized  Amniotic Fluid  AFI FV:      Within normal limits                              Largest Pocket(cm)                              6.27 ---------------------------------------------------------------------- Biometry  BPD:      69.2  mm  G. Age:  27w 6d         86  %    CI:        77.13   %    70 - 86                                                          FL/HC:      17.7   %    18.6 - 20.4  HC:      249.5  mm     G. Age:  27w 1d         52   %    HC/AC:      1.21        1.04 - 1.22  AC:      206.6  mm     G. Age:  25w 2d         14  %    FL/BPD:     63.7   %    71 - 87  FL:       44.1  mm     G. Age:  24w 4d          3  %    FL/AC:      21.3   %    20 - 24  HUM:      39.6  mm     G. Age:  24w 1d        < 5  %  LV:        3.1  mm  Est. FW:     791  gm    1 lb 12 oz       9  % ---------------------------------------------------------------------- OB History  Gravidity:    5          SAB:   3  Ectopic:      1        Living:  0 ---------------------------------------------------------------------- Gestational Age  LMP:           26w 2d        Date:  05/01/19                 EDD:   02/05/20  U/S Today:     26w 2d                                        EDD:   02/05/20  Best:          26w 2d     Det. By:  LMP  (05/01/19)          EDD:   02/05/20 ---------------------------------------------------------------------- Anatomy  Cranium:               Appears normal         LVOT:                   Previously seen  Cavum:                 Appears normal         Aortic Arch:            Previously seen  Ventricles:  Appears normal         Ductal Arch:            Not well visualized  Choroid Plexus:        Resolved CPCs          Diaphragm:              Appears normal  Cerebellum:            Previously seen        Stomach:                Appears normal, left                                                                        sided  Posterior Fossa:       Previously seen        Abdomen:                Appears normal  Nuchal Fold:           Previously seen        Abdominal Wall:         Previously seen  Face:                  Orbits and profile     Cord Vessels:           Previously seen                         previously seen  Lips:                  Previously seen        Kidneys:                Appear normal  Palate:                Not well visualized    Bladder:                Appears normal  Thoracic:              Appears normal         Spine:                   Previously seen  Heart:                 Echogenic focus        Upper Extremities:      Previously seen                         previously seen  RVOT:                  Appears normal         Lower Extremities:      Previously seen  Other:  Fetus appears to be a female. Nasal bone visualized. Heels visualized.          Technically difficult due to maternal habitus and fetal position. ---------------------------------------------------------------------- Doppler - Fetal Vessels  Umbilical Artery   S/D     %tile  3.73  75 ---------------------------------------------------------------------- Cervix Uterus Adnexa  Cervix  Not visualized (advanced GA >24wks) ---------------------------------------------------------------------- Impression  Patient with chronic hypertension well controlled without  antihypertensives return for fetal growth assessment.  She  takes Lovenox prophylaxis because of a history of recurrent  early miscarriages.  On ultrasound, amniotic fluid is normal and good fetal activity  seen.  The estimated fetal weight is at the 9th percentile and  the abdominal circumference measurement is at greater than  10th percentile.  Umbilical artery Doppler study, performed  because of suspected fetal growth restriction, shows normal  forward diastolic flow.  Previously seen choroid plexus cysts  have resolved.  I explained the finding of suspected fetal growth restriction  and it is sometimes difficult to differentiate from a  constitutionally small fetus. ---------------------------------------------------------------------- Recommendations  -UA Doppler study in 2 weeks.  -Fetal growth and UA Doppler in 3 weeks. ----------------------------------------------------------------------                       Noralee Space, MD Electronically Signed Corrected Final Report  11/02/2019 04:21 pm ----------------------------------------------------------------------  [place] MFM UA CORD DOPPLER  Result Date:  11/22/2019 ----------------------------------------------------------------------  OBSTETRICS REPORT                       (Signed Final 11/22/2019 04:52 pm) ---------------------------------------------------------------------- Patient Info  ID #:       [id number]                          D.O.B.:  21-Sep-1986 (33 yrs)  Name:       Denise [name]                   Visit Date: 11/22/2019 03:28 pm ---------------------------------------------------------------------- Performed By  Performed By:     Eden Lathe BS      Ref. Address:     Faculty                    RDMS RVT  Attending:        Noralee Space MD        [facility]  Referred By:      Catalina Antigua MD ---------------------------------------------------------------------- Orders   #  Description                          Code         Ordered By   1  [place] MFM UA CORD DOPPLER               76820.02     RAVI SHANKAR   2  [place] MFM OB FOLLOW UP                  87564.33     RAVI SHANKAR  ----------------------------------------------------------------------   #  Order #  Accession #                 Episode #   1  [id number]                  [id number]                  [id number]   2  [id number]                  [id number]                  [id number]  ---------------------------------------------------------------------- Indications   Maternal care for known or suspected poor      O36.5930   fetal growth, third trimester, not applicable or   unspecified IUGR   Obesity complicating pregnancy, third          O99.213   trimester  (Pre Pregnancy BMI 37)   Encounter for other antenatal screening        Z36.2   follow-up (low risk NIPS)   Hypertension - Chronic/Pre-existing (ASA)      O10.019   Poor obstetric history-Recurrent (habitual)    O26.20   abortion (3 consecutive ab's)   Fetal abnormality - other known or             O35.9XX0   suspected  (Bilat CPC, EIFLV)   Maternal Clotting Disorder (taking Lovanox)   Uterine fibroids                               O34.10   [redacted] weeks gestation of pregnancy                Z3A.29  ---------------------------------------------------------------------- Vital Signs                                                 Height:        5'4" ---------------------------------------------------------------------- Fetal Evaluation  Num Of Fetuses:         1  Fetal Heart Rate(bpm):  150  Cardiac Activity:       Observed  Presentation:           Cephalic  Placenta:               Anterior Fundal  P. Cord Insertion:      Visualized  Amniotic Fluid  AFI FV:      Within normal limits  AFI Sum(cm)     %Tile       Largest Pocket(cm)  12.3            32          3.9  RUQ(cm)       RLQ(cm)       LUQ(cm)        LLQ(cm)  3.42          3.58          3.9            1.4 ---------------------------------------------------------------------- Biometry  BPD:      80.2  mm     G. Age:  32w 1d         98  %    CI:         81.4   %    70 - 86  FL/HC:      17.8   %    19.6 - 20.8  HC:      280.6  mm     G. Age:  30w 5d         60  %    HC/AC:      1.21        0.99 - 1.21  AC:      232.8  mm     G. Age:  27w 4d          7  %    FL/BPD:     62.3   %    71 - 87  FL:         50  mm     G. Age:  26w 6d          1  %    FL/AC:      21.5   %    20 - 24  HUM:      45.9  mm     G. Age:  27w 0d        < 5  %  Est. FW:    1150  gm      2 lb 9 oz      6  % ---------------------------------------------------------------------- OB History  Gravidity:    5          SAB:   3  Ectopic:      1        Living:  0 ---------------------------------------------------------------------- Gestational Age  LMP:           29w 2d        Date:  05/01/19                 EDD:   02/05/20  U/S Today:     29w 2d                                        EDD:   02/05/20  Best:          29w 2d     Det. By:  LMP  (05/01/19)          EDD:    02/05/20 ---------------------------------------------------------------------- Anatomy  Cavum:                 Appears normal         Aortic Arch:            Previously seen  Ventricles:            Appears normal         Ductal Arch:            Not well visualized  Choroid Plexus:        Resolved CPCs          Diaphragm:              Appears normal  Cerebellum:            Appears normal         Stomach:                Appears normal, left  sided  Posterior Fossa:       Previously seen        Abdomen:                Appears normal  Nuchal Fold:           Previously seen        Abdominal Wall:         Previously seen  Face:                  Appears normal         Cord Vessels:           Previously seen                         (orbits and profile)  Lips:                  Appears normal         Kidneys:                Appear normal  Palate:                Not well visualized    Bladder:                Appears normal  Thoracic:              Appears normal         Spine:                  Previously seen  Heart:                 Echogenic focus        Upper Extremities:      Previously seen                         previously seen  RVOT:                  Previously seen        Lower Extremities:      Previously seen  LVOT:                  Previously seen  Other:  Nasal bone visualized. Heels previously seen. Technically difficult          due to maternal habitus and fetal position. ---------------------------------------------------------------------- Doppler - Fetal Vessels  Umbilical Artery   S/D     %tile                                            ADFV    RDFV  4.42       97                                                No      No ---------------------------------------------------------------------- Cervix Uterus Adnexa  Cervix  Not visualized (advanced GA >24wks)  Uterus  Single fibroid previously noted.  Left Ovary  Not visualized.  Right Ovary   Not visualized.  Cul De Sac  No free fluid seen.  Adnexa  No abnormality visualized. ---------------------------------------------------------------------- Impression  Fetal growth  restriction.  The estimated fetal weight is at the 6 percentile (interval  weight gain 359 g).  Amniotic fluid is normal good fetal activity  seen.  Umbilical artery Doppler showed increased S/D ratio.  I discussed the significance of Doppler studies and antenatal  testing. ---------------------------------------------------------------------- Recommendations  -UA Doppler and NST next week. ----------------------------------------------------------------------                  Noralee Space, MD Electronically Signed Final Report   11/22/2019 04:52 pm ----------------------------------------------------------------------  [place] MFM UA CORD DOPPLER  Result Date: 11/15/2019 ----------------------------------------------------------------------  OBSTETRICS REPORT                       (Signed Final 11/15/2019 11:45 am) ---------------------------------------------------------------------- Patient Info  ID #:       [id number]                          D.O.B.:  01/30/1986 (33 yrs)  Name:       Denise [name]                   Visit Date: 11/15/2019 10:46 am ---------------------------------------------------------------------- Performed By  Performed By:     Tommie Raymond BS,       Ref. Address:     Faculty                    RDMS, RVT  Attending:        Ma Rings MD         [facility]  Referred By:      Catalina Antigua MD ---------------------------------------------------------------------- Orders   #  Description                          Code         Ordered By   1  [place] MFM UA CORD DOPPLER               40981.19     Noralee Space  ----------------------------------------------------------------------   #  Order #                     Accession #                 Episode #   1  [id number]                  [id number]                  [id number]  ---------------------------------------------------------------------- Indications   Maternal care for known or suspected poor      O36.5931   fetal growth, third trimester, fetus 1 IUGR   Obesity complicating pregnancy, third          O99.213   trimester  (Pre Pregnancy BMI 37)   [redacted] weeks gestation of pregnancy  Z3A.28   Encounter for other antenatal screening        Z36.2   follow-up (low risk NIPS)   Hypertension - Chronic/Pre-existing (ASA)      O10.019   Poor obstetric history-Recurrent (habitual)    O26.20   abortion (3 consecutive ab's)   Fetal abnormality - other known or             O35.9XX0   suspected (Bilat CPC, EIFLV)   Maternal Clotting Disorder (taking Lovanox)   Uterine fibroids                               O34.10  ---------------------------------------------------------------------- Vital Signs                                                 Height:        5'4" ---------------------------------------------------------------------- Fetal Evaluation  Num Of Fetuses:         1  Fetal Heart Rate(bpm):  153  Cardiac Activity:       Observed  Presentation:           Homero FellersFrank breech  Placenta:               Anterior Fundal  P. Cord Insertion:      Previously Visualized  Amniotic Fluid  AFI FV:      Within normal limits  AFI Sum(cm)     %Tile       Largest Pocket(cm)  16              58          4.61  RUQ(cm)       RLQ(cm)       LUQ(cm)        LLQ(cm)  3.37          4.35          3.67           4.61 ---------------------------------------------------------------------- Biometry  LV:        6.2  mm ---------------------------------------------------------------------- OB History  Gravidity:    5          SAB:   3  Ectopic:      1        Living:  0 ---------------------------------------------------------------------- Gestational Age  LMP:           28w 2d        Date:  05/01/19                  EDD:   02/05/20  Best:          Eden Emms28w 2d     Det. By:  LMP  (05/01/19)          EDD:   02/05/20 ---------------------------------------------------------------------- Anatomy  Cranium:               Previously seen        LVOT:                   Previously seen  Cavum:                 Previously seen        Aortic Arch:            Previously seen  Ventricles:  Appears normal         Ductal Arch:            Not well visualized  Choroid Plexus:        Resolved CPCs          Diaphragm:              Previously seen                         prev.  Cerebellum:            Previously seen        Stomach:                Previously Seen  Posterior Fossa:       Previously seen        Abdomen:                Previously seen  Nuchal Fold:           Previously seen        Abdominal Wall:         Previously seen  Face:                  Orbits and profile     Cord Vessels:           Previously seen                         previously seen  Lips:                  Previously seen        Kidneys:                Previously seen  Palate:                Not well visualized    Bladder:                Previously seen  Thoracic:              Previously seen        Spine:                  Previously seen  Heart:                 Echogenic focus        Upper Extremities:      Previously seen                         previously seen  RVOT:                  Previously seen        Lower Extremities:      Previously seen  Other:  Female gender previously seen. Nasal bone previously seen. Heels          previously seen. Technically difficult due to maternal habitus and fetal          position. ---------------------------------------------------------------------- Doppler - Fetal Vessels  Umbilical Artery   S/D     %tile     RI              PI                     ADFV    RDFV  4.18  93   0.76             1.33                        No      No ---------------------------------------------------------------------- Cervix Uterus Adnexa   Cervix  Not visualized (advanced GA >24wks)  Uterus  Single fibroid noted, see table below.  Left Ovary  Within normal limits. No adnexal mass visualized.  Right Ovary  Within normal limits. No adnexal mass visualized.  Cul De Sac  No free fluid seen.  Adnexa  No abnormality visualized. ---------------------------------------------------------------------- Myomas   Site                     L(cm)      W(cm)      D(cm)      Location   Fundus Left              4.51       4.46       3.82       Intramural  ----------------------------------------------------------------------   Blood Flow                 RI        PI       Comments  ---------------------------------------------------------------------- Comments  This patient was seen due to an IUGR fetus.  She denies any  problems since her last exam.  There was normal amniotic fluid noted on today's ultrasound  exam.  Fetal movements and fetal breathing movements  were also noted throughout today's exam.  Doppler studies of the umbilical arteries performed due to  fetal growth restriction showed an elevated S/D ratio of 4.18.  A follow-up exam was scheduled in 1 week. ----------------------------------------------------------------------                   Johnell Comings, MD Electronically Signed Final Report   11/15/2019 11:45 am ----------------------------------------------------------------------  [place] MFM UA CORD DOPPLER  Result Date: 11/02/2019 ----------------------------------------------------------------------  OBSTETRICS REPORT                    (Corrected Final 11/02/2019 04:21 pm) ---------------------------------------------------------------------- Patient Info  ID #:       [id number]                          D.O.B.:  January 09, 1986 (33 yrs)  Name:       Denise [name]                   Visit Date: 11/01/2019 02:11 pm ---------------------------------------------------------------------- Performed By  Performed By:     Novella Rob        Ref. Address:      Faculty                    RDMS  Attending:        Tama High MD        [facility]  Referred By:  Oshua Mcconaha MD ---------------------------------------------------------------------- Orders   #  Description                          Code         Ordered By   1  [place] MFM OB FOLLOW UP                  76816.01     RAVI SHANKAR   2  [place] MFM UA CORD DOPPLER               76820.02     RAVI [facility]  ----------------------------------------------------------------------   #  Order #                    Accession #                 Episode #   1  [id number]                  [id number]   2  [id number]                  [id number]                  [id number]  ---------------------------------------------------------------------- Indications   Obesity complicating pregnancy, second         O99.212   trimester (Pre Pregnancy BMI 37)   Encounter for other antenatal screening        Z36.2   follow-up (low risk NIPS)   Hypertension - Chronic/Pre-existing (ASA)      O10.019   [redacted] weeks gestation of pregnancy                Z3A.26   Poor obstetric history-Recurrent (habitual)    O26.20   abortion (3 consecutive ab's)   Fetal abnormality - other known or             O35.9XX0   suspected (Bilat CPC, EIFLV)   Maternal Clotting Disorder (taking Lovanox)   Uterine fibroids                               O34.10   Maternal care for known or suspected poor      O36.5920   fetal growth, second trimester, not applicable   or unspecified IUGR  ---------------------------------------------------------------------- Vital Signs  Weight (lb): 219                               Height:        5'4"  BMI:         37.59 ---------------------------------------------------------------------- Fetal Evaluation  Num Of Fetuses:         1  Cardiac Activity:       Observed  Presentation:           Cephalic  Placenta:                Anterior Fundal  P. Cord Insertion:      Previously Visualized  Amniotic Fluid  AFI FV:      Within normal limits  Largest Pocket(cm)                              6.27 ---------------------------------------------------------------------- Biometry  BPD:      69.2  mm     G. Age:  27w 6d         86  %    CI:        77.13   %    70 - 86                                                          FL/HC:      17.7   %    18.6 - 20.4  HC:      249.5  mm     G. Age:  27w 1d         52  %    HC/AC:      1.21        1.04 - 1.22  AC:      206.6  mm     G. Age:  25w 2d         14  %    FL/BPD:     63.7   %    71 - 87  FL:       44.1  mm     G. Age:  24w 4d          3  %    FL/AC:      21.3   %    20 - 24  HUM:      39.6  mm     G. Age:  24w 1d        < 5  %  LV:        3.1  mm  Est. FW:     791  gm    1 lb 12 oz       9  % ---------------------------------------------------------------------- OB History  Gravidity:    5          SAB:   3  Ectopic:      1        Living:  0 ---------------------------------------------------------------------- Gestational Age  LMP:           26w 2d        Date:  05/01/19                 EDD:   02/05/20  U/S Today:     26w 2d                                        EDD:   02/05/20  Best:          26w 2d     Det. By:  LMP  (05/01/19)          EDD:   02/05/20 ---------------------------------------------------------------------- Anatomy  Cranium:               Appears normal         LVOT:                   Previously seen  Cavum:                 Appears normal         Aortic Arch:            Previously seen  Ventricles:            Appears normal         Ductal Arch:            Not well visualized  Choroid Plexus:        Resolved CPCs          Diaphragm:              Appears normal  Cerebellum:            Previously seen        Stomach:                Appears normal, left                                                                        sided  Posterior  Fossa:       Previously seen        Abdomen:                Appears normal  Nuchal Fold:           Previously seen        Abdominal Wall:         Previously seen  Face:                  Orbits and profile     Cord Vessels:           Previously seen                         previously seen  Lips:                  Previously seen        Kidneys:                Appear normal  Palate:                Not well visualized    Bladder:                Appears normal  Thoracic:              Appears normal         Spine:                  Previously seen  Heart:                 Echogenic focus        Upper Extremities:      Previously seen                         previously seen  RVOT:                  Appears normal         Lower Extremities:      Previously seen  Other:  Fetus appears to be a female. Nasal bone visualized. Heels visualized.          Technically difficult due to maternal habitus and fetal position. ---------------------------------------------------------------------- Doppler - Fetal Vessels  Umbilical Artery   S/D     %tile  3.73       75 ---------------------------------------------------------------------- Cervix Uterus Adnexa  Cervix  Not visualized (advanced GA >24wks) ---------------------------------------------------------------------- Impression  Patient with chronic hypertension well controlled without  antihypertensives return for fetal growth assessment.  She  takes Lovenox prophylaxis because of a history of recurrent  early miscarriages.  On ultrasound, amniotic fluid is normal and good fetal activity  seen.  The estimated fetal weight is at the 9th percentile and  the abdominal circumference measurement is at greater than  10th percentile.  Umbilical artery Doppler study, performed  because of suspected fetal growth restriction, shows normal  forward diastolic flow.  Previously seen choroid plexus cysts  have resolved.  I explained the finding of suspected fetal growth restriction  and it is  sometimes difficult to differentiate from a  constitutionally small fetus. ---------------------------------------------------------------------- Recommendations  -UA Doppler study in 2 weeks.  -Fetal growth and UA Doppler in 3 weeks. ----------------------------------------------------------------------                       Noralee Space, MD Electronically Signed Corrected Final Report  11/02/2019 04:21 pm ----------------------------------------------------------------------   Assessment and Plan:  Pregnancy: G5P0040 at [redacted]w[redacted]d 1. Supervision of high risk pregnancy, antepartum Patient is doing well Normal glucola last visit  2. Chronic hypertension affecting pregnancy Currently not on any antihypertensive Continue ASA Patient with suspected fetal growth restriction on 11/22/2019 ultrasound. NST and dopplers scheduled on 11/29/19 Discussed expectations with NST and weekly dopplers  3. Rh negative status during pregnancy in third trimester S/p rhogam on 11/13/19  4. Recurrent pregnancy loss Continue prophylaxis lovenox  Preterm labor symptoms and general obstetric precautions including but not limited to vaginal bleeding, contractions, leaking of fluid and fetal movement were reviewed in detail with the patient. I discussed the assessment and treatment plan with the patient. The patient was provided an opportunity to ask questions and all were answered. The patient agreed with the plan and demonstrated an understanding of the instructions. The patient was advised to call back or seek an in-person office evaluation/go to [facility] at [facility] for any urgent or concerning symptoms. Please refer to After Visit Summary for other counseling recommendations.   I provided 15 minutes of face-to-face time during this encounter.  No follow-ups on file.  Future Appointments  Date Time Provider Department Center  11/27/2019  3:45 PM Deyanna Mctier, Gigi Gin, MD [facility] None  11/29/2019  3:15 PM  [facility] NST [facility]  11/29/2019  3:15 PM [facility] NURSE [facility]  11/29/2019  4:00 PM [facility] [place] 3 [facility]  12/06/2019  2:10 PM [facility] NURSE [facility]  12/06/2019  2:15 PM [facility] NST [facility]  12/06/2019  2:45 PM [facility] [place] 2 [facility] M[facility]-US  12/13/2019  2:10 PM [facility] NURSE [facility]  12/13/2019  2:15 PM [facility] NST [facility]  12/13/2019  3:15 PM [facility] [place] 4 [facility] M[facility]-US    Catalina Antigua, MD [facility] [employer]

## 2019-11-29 ENCOUNTER — Encounter (HOSPITAL_COMMUNITY): Payer: Self-pay

## 2019-11-29 ENCOUNTER — Ambulatory Visit (HOSPITAL_COMMUNITY): Payer: BC Managed Care – PPO

## 2019-11-29 ENCOUNTER — Ambulatory Visit (HOSPITAL_COMMUNITY): Payer: BC Managed Care – PPO | Admitting: *Deleted

## 2019-11-29 ENCOUNTER — Ambulatory Visit (HOSPITAL_COMMUNITY)
Admission: RE | Admit: 2019-11-29 | Discharge: 2019-11-29 | Disposition: A | Discharge status: Home / Self Care | Payer: BC Managed Care – PPO | Source: Ambulatory Visit | Attending: Obstetrics and Gynecology | Admitting: Obstetrics and Gynecology

## 2019-11-29 ENCOUNTER — Other Ambulatory Visit: Payer: Self-pay

## 2019-11-29 DIAGNOSIS — O10919 Unspecified pre-existing hypertension complicating pregnancy, unspecified trimester: Secondary | ICD-10-CM

## 2019-11-29 DIAGNOSIS — O365930 Maternal care for other known or suspected poor fetal growth, third trimester, not applicable or unspecified: Secondary | ICD-10-CM | POA: Diagnosis not present

## 2019-11-29 DIAGNOSIS — O10013 Pre-existing essential hypertension complicating pregnancy, third trimester: Secondary | ICD-10-CM | POA: Diagnosis not present

## 2019-11-29 DIAGNOSIS — O2623 Pregnancy care for patient with recurrent pregnancy loss, third trimester: Secondary | ICD-10-CM | POA: Diagnosis not present

## 2019-11-29 DIAGNOSIS — O99213 Obesity complicating pregnancy, third trimester: Secondary | ICD-10-CM

## 2019-11-29 DIAGNOSIS — O359XX0 Maternal care for (suspected) fetal abnormality and damage, unspecified, not applicable or unspecified: Secondary | ICD-10-CM

## 2019-11-29 DIAGNOSIS — Z3A30 30 weeks gestation of pregnancy: Secondary | ICD-10-CM

## 2019-11-29 NOTE — Procedures (Signed)
Areil Kriegel 09/23/1986 [redacted]w[redacted]d  Fetus A Non-Stress Test Interpretation for 11/29/19  Indication: IUGR  Fetal Heart Rate A Mode: External Baseline Rate (A): 130 bpm Variability: Moderate Accelerations: 15 x 15 Decelerations: None  Uterine Activity Mode: Toco Contraction Frequency (min): none noted  Interpretation (Fetal Testing) Nonstress Test Interpretation: Reactive Comments: FHR tracing rev'd by Dr. Fang   

## 2019-11-29 NOTE — Procedures (Signed)
Denise [name] [birth year] [redacted]w[redacted]d  Fetus A Non-Stress Test Interpretation for 11/29/19  Indication: IUGR  Fetal Heart Rate A Mode: External Baseline Rate (A): 130 bpm Variability: Moderate Accelerations: 15 x 15 Decelerations: None  Uterine Activity Mode: Toco Contraction Frequency (min): none noted  Interpretation (Fetal Testing) Nonstress Test Interpretation: Reactive Comments: FHR tracing rev'd by Dr. Parke Poisson

## 2019-12-06 ENCOUNTER — Ambulatory Visit (HOSPITAL_COMMUNITY): Payer: BC Managed Care – PPO | Admitting: *Deleted

## 2019-12-06 ENCOUNTER — Other Ambulatory Visit: Payer: Self-pay

## 2019-12-06 ENCOUNTER — Ambulatory Visit (HOSPITAL_COMMUNITY)
Admission: RE | Admit: 2019-12-06 | Discharge: 2019-12-06 | Disposition: A | Discharge status: Home / Self Care | Payer: BC Managed Care – PPO | Source: Ambulatory Visit | Attending: Obstetrics and Gynecology | Admitting: Obstetrics and Gynecology

## 2019-12-06 ENCOUNTER — Encounter (HOSPITAL_COMMUNITY): Payer: Self-pay

## 2019-12-06 DIAGNOSIS — O10013 Pre-existing essential hypertension complicating pregnancy, third trimester: Secondary | ICD-10-CM | POA: Diagnosis not present

## 2019-12-06 DIAGNOSIS — O10919 Unspecified pre-existing hypertension complicating pregnancy, unspecified trimester: Secondary | ICD-10-CM | POA: Insufficient documentation

## 2019-12-06 DIAGNOSIS — O99213 Obesity complicating pregnancy, third trimester: Secondary | ICD-10-CM

## 2019-12-06 DIAGNOSIS — O365930 Maternal care for other known or suspected poor fetal growth, third trimester, not applicable or unspecified: Secondary | ICD-10-CM

## 2019-12-06 DIAGNOSIS — O2623 Pregnancy care for patient with recurrent pregnancy loss, third trimester: Secondary | ICD-10-CM

## 2019-12-06 DIAGNOSIS — O3413 Maternal care for benign tumor of corpus uteri, third trimester: Secondary | ICD-10-CM

## 2019-12-06 DIAGNOSIS — O359XX0 Maternal care for (suspected) fetal abnormality and damage, unspecified, not applicable or unspecified: Secondary | ICD-10-CM

## 2019-12-06 DIAGNOSIS — Z3A31 31 weeks gestation of pregnancy: Secondary | ICD-10-CM

## 2019-12-06 NOTE — Procedures (Signed)
Denise [name] [birth year] [redacted]w[redacted]d  Fetus A Non-Stress Test Interpretation for 12/06/19  Indication: IUGR  Fetal Heart Rate A Mode: External Baseline Rate (A): 145 bpm Variability: Moderate Accelerations: 15 x 15 Decelerations: None Multiple birth?: No  Uterine Activity Mode: Toco Contraction Frequency (min): none noted  Interpretation (Fetal Testing) Nonstress Test Interpretation: Reactive Comments: FHR tracing rev'd by Dr. Parke Poisson

## 2019-12-11 ENCOUNTER — Encounter: Payer: Self-pay | Admitting: Obstetrics and Gynecology

## 2019-12-11 ENCOUNTER — Telehealth (INDEPENDENT_AMBULATORY_CARE_PROVIDER_SITE_OTHER): Payer: BC Managed Care – PPO | Admitting: Obstetrics and Gynecology

## 2019-12-11 VITALS — BP 124/83 | HR 96 | Wt 227.0 lb

## 2019-12-11 DIAGNOSIS — O0993 Supervision of high risk pregnancy, unspecified, third trimester: Secondary | ICD-10-CM

## 2019-12-11 DIAGNOSIS — O26893 Other specified pregnancy related conditions, third trimester: Secondary | ICD-10-CM

## 2019-12-11 DIAGNOSIS — O10919 Unspecified pre-existing hypertension complicating pregnancy, unspecified trimester: Secondary | ICD-10-CM

## 2019-12-11 DIAGNOSIS — N96 Recurrent pregnancy loss: Secondary | ICD-10-CM

## 2019-12-11 DIAGNOSIS — Z3A32 32 weeks gestation of pregnancy: Secondary | ICD-10-CM

## 2019-12-11 DIAGNOSIS — Z6791 Unspecified blood type, Rh negative: Secondary | ICD-10-CM

## 2019-12-11 DIAGNOSIS — O10913 Unspecified pre-existing hypertension complicating pregnancy, third trimester: Secondary | ICD-10-CM

## 2019-12-11 DIAGNOSIS — O0990 Supervision of high risk pregnancy, unspecified, unspecified trimester: Secondary | ICD-10-CM

## 2019-12-11 NOTE — Progress Notes (Signed)
TELEHEALTH OBSTETRICS PRENATAL VIRTUAL VIDEO VISIT ENCOUNTER NOTE  Provider location: [facility] [place]   I connected with Denise [name] on 12/11/19 at  3:30 PM EST by MyChart Video Encounter at home and verified that I am speaking with the correct person using two identifiers.   I discussed the limitations, risks, security and privacy concerns of performing an evaluation and management service virtually and the availability of in person appointments. I also discussed with the patient that there may be a patient responsible charge related to this service. The patient expressed understanding and agreed to proceed. Subjective:  Denise [name] is a 34 y.o. G5P0040 at [redacted]w[redacted]d being seen today for ongoing prenatal care.  She is currently monitored for the following issues for this high-risk pregnancy and has Recurrent pregnancy loss; Supervision of high risk pregnancy, antepartum; Chronic hypertension affecting pregnancy; Rh negative status during pregnancy; and Fetal echogenic intracardiac focus on prenatal ultrasound on their problem list.  Patient reports no complaints.  Contractions: Not present. Vag. Bleeding: None.  Movement: Present. Denies any leaking of fluid.   The following portions of the patient's history were reviewed and updated as appropriate: allergies, current medications, past family history, past medical history, past social history, past surgical history and problem list.   Objective:   Vitals:   12/11/19 1517  BP: 124/83  Pulse: 96  Weight: 227 lb (103 kg)    Fetal Status:     Movement: Present     General:  Alert, oriented and cooperative. Patient is in no acute distress.  Respiratory: Normal respiratory effort, no problems with respiration noted  Mental Status: Normal mood and affect. Normal behavior. Normal judgment and thought content.  Rest of physical exam deferred due to type of encounter  Imaging: [place] MFM OB FOLLOW UP  Result Date:  11/22/2019 ----------------------------------------------------------------------  OBSTETRICS REPORT                       (Signed Final 11/22/2019 04:52 pm) ---------------------------------------------------------------------- Patient Info  ID #:       [id number]                          D.O.B.:  12-Apr-1986 (33 yrs)  Name:       Denise [name]                   Visit Date: 11/22/2019 03:28 pm ---------------------------------------------------------------------- Performed By  Performed By:     Eden Lathe BS      Ref. Address:     Faculty                    RDMS RVT  Attending:        Noralee Space MD        [facility]  Referred By:      Catalina Antigua MD ---------------------------------------------------------------------- Orders   #  Description  Code         Ordered By   1  [place] MFM UA CORD DOPPLER               [id number]     RAVI SHANKAR   2  [place] MFM OB FOLLOW UP                  [id number]     RAVI SHANKAR  ----------------------------------------------------------------------   #  Order #                    Accession #                 Episode #   1  [id number]                  [id number]                  [id number]   2  [id number]                  [id number]                  [id number]  ---------------------------------------------------------------------- Indications   Maternal care for known or suspected poor      O36.5930   fetal growth, third trimester, not applicable or   unspecified IUGR   Obesity complicating pregnancy, third          O99.213   trimester  (Pre Pregnancy BMI 37)   Encounter for other antenatal screening        Z36.2   follow-up (low risk NIPS)   Hypertension - Chronic/Pre-existing (ASA)      O10.019   Poor obstetric history-Recurrent (habitual)    O26.20   abortion (3 consecutive ab's)   Fetal abnormality - other known or             O35.9XX0   suspected  (Bilat CPC, EIFLV)   Maternal Clotting Disorder (taking Lovanox)   Uterine fibroids                               O34.10   [redacted] weeks gestation of pregnancy                Z3A.29  ---------------------------------------------------------------------- Vital Signs                                                 Height:        5'4" ---------------------------------------------------------------------- Fetal Evaluation  Num Of Fetuses:         1  Fetal Heart Rate(bpm):  150  Cardiac Activity:       Observed  Presentation:           Cephalic  Placenta:               Anterior Fundal  P. Cord Insertion:      Visualized  Amniotic Fluid  AFI FV:      Within normal limits  AFI Sum(cm)     %Tile       Largest Pocket(cm)  12.3            32          3.9  RUQ(cm)       RLQ(cm)  LUQ(cm)        LLQ(cm)  3.42          3.58          3.9            1.4 ---------------------------------------------------------------------- Biometry  BPD:      80.2  mm     G. Age:  32w 1d         98  %    CI:         81.4   %    70 - 86                                                          FL/HC:      17.8   %    19.6 - 20.8  HC:      280.6  mm     G. Age:  30w 5d         60  %    HC/AC:      1.21        0.99 - 1.21  AC:      232.8  mm     G. Age:  27w 4d          7  %    FL/BPD:     62.3   %    71 - 87  FL:         50  mm     G. Age:  26w 6d          1  %    FL/AC:      21.5   %    20 - 24  HUM:      45.9  mm     G. Age:  27w 0d        < 5  %  Est. FW:    1150  gm      2 lb 9 oz      6  % ---------------------------------------------------------------------- OB History  Gravidity:    5          SAB:   3  Ectopic:      1        Living:  0 ---------------------------------------------------------------------- Gestational Age  LMP:           29w 2d        Date:  05/01/19                 EDD:   02/05/20  U/S Today:     29w 2d                                        EDD:   02/05/20  Best:          29w 2d     Det. By:  LMP  (05/01/19)          EDD:    02/05/20 ---------------------------------------------------------------------- Anatomy  Cavum:                 Appears normal         Aortic Arch:            Previously seen  Ventricles:            Appears normal         Ductal Arch:            Not well visualized  Choroid Plexus:        Resolved CPCs          Diaphragm:              Appears normal  Cerebellum:            Appears normal         Stomach:                Appears normal, left                                                                        sided  Posterior Fossa:       Previously seen        Abdomen:                Appears normal  Nuchal Fold:           Previously seen        Abdominal Wall:         Previously seen  Face:                  Appears normal         Cord Vessels:           Previously seen                         (orbits and profile)  Lips:                  Appears normal         Kidneys:                Appear normal  Palate:                Not well visualized    Bladder:                Appears normal  Thoracic:              Appears normal         Spine:                  Previously seen  Heart:                 Echogenic focus        Upper Extremities:      Previously seen                         previously seen  RVOT:                  Previously seen        Lower Extremities:      Previously seen  LVOT:                  Previously seen  Other:  Nasal bone visualized. Heels previously seen. Technically difficult  due to maternal habitus and fetal position. ---------------------------------------------------------------------- Doppler - Fetal Vessels  Umbilical Artery   S/D     %tile                                            ADFV    RDFV  4.42       97                                                No      No ---------------------------------------------------------------------- Cervix Uterus Adnexa  Cervix  Not visualized (advanced GA >24wks)  Uterus  Single fibroid previously noted.  Left Ovary  Not visualized.  Right Ovary   Not visualized.  Cul De Sac  No free fluid seen.  Adnexa  No abnormality visualized. ---------------------------------------------------------------------- Impression  Fetal growth restriction.  The estimated fetal weight is at the 6 percentile (interval  weight gain 359 g).  Amniotic fluid is normal good fetal activity  seen.  Umbilical artery Doppler showed increased S/D ratio.  I discussed the significance of Doppler studies and antenatal  testing. ---------------------------------------------------------------------- Recommendations  -UA Doppler and NST next week. ----------------------------------------------------------------------                  Noralee Space, MD Electronically Signed Final Report   11/22/2019 04:52 pm ----------------------------------------------------------------------  [place] MFM UA CORD DOPPLER  Result Date: 12/06/2019 ----------------------------------------------------------------------  OBSTETRICS REPORT                       (Signed Final 12/06/2019 03:18 pm) ---------------------------------------------------------------------- Patient Info  ID #:       [id number]                          D.O.B.:  [birth year] (33 yrs)  Name:       Denise [name]                   Visit Date: 12/06/2019 02:39 pm ---------------------------------------------------------------------- Performed By  Performed By:     Marcellina Millin          Ref. Address:     Faculty                    RDMS  Attending:        Ma Rings MD         [facility]  Referred By:      Gigi Gin                    CONSTANT MD ---------------------------------------------------------------------- Orders   #  Description                          Code  Ordered By   1  [place] MFM UA CORD DOPPLER               [id number]     RAVI [facility]  ----------------------------------------------------------------------   #  Order #                     Accession #                 Episode #   1  [id number]                  [id number]                  [id number]  ---------------------------------------------------------------------- Indications   Maternal care for known or suspected poor      O36.5930   fetal growth, third trimester, not applicable or   unspecified IUGR   [redacted] weeks gestation of pregnancy                Z3A.31   Obesity complicating pregnancy, third          O99.213   trimester  (Pre Pregnancy BMI 37)   Hypertension - Chronic/Pre-existing (ASA)      O10.019   Poor obstetric history-Recurrent (habitual)    O26.20   abortion (3 consecutive ab's)   Fetal abnormality - other known or             O35.9XX0   suspected (Bilat CPC, EIFLV)   Maternal Clotting Disorder (taking Lovanox)   Uterine fibroids                               O34.10  ---------------------------------------------------------------------- Vital Signs                                                 Height:        5'4" ---------------------------------------------------------------------- Fetal Evaluation  Num Of Fetuses:         1  Fetal Heart Rate(bpm):  145  Cardiac Activity:       Observed  Presentation:           Cephalic  Placenta:               Right lateral  Amniotic Fluid  AFI FV:      Within normal limits  AFI Sum(cm)     %Tile       Largest Pocket(cm)  13.3            41          5.27  RUQ(cm)       RLQ(cm)       LUQ(cm)        LLQ(cm)  3.31          5.27          2.66           2.06 ---------------------------------------------------------------------- OB History  Gravidity:    5          SAB:   3  Ectopic:      1        Living:  0 ---------------------------------------------------------------------- Gestational Age  LMP:           31w 2d        Date:  05/01/19  EDD:   02/05/20  Best:          31w 2d     Det. By:  LMP  (05/01/19)          EDD:   02/05/20 ---------------------------------------------------------------------- Anatomy  Stomach:               Appears  normal, left   Bladder:                Appears normal                         sided ---------------------------------------------------------------------- Doppler - Fetal Vessels  Umbilical Artery   S/D     %tile                                            ADFV    RDFV  3.03       67                                                No      No ---------------------------------------------------------------------- Comments  This patient was seen due to an IUGR fetus.  She denies any  problems since her last exam.  The patient had a reactive nonstress test today.  There was normal amniotic fluid noted on today's ultrasound  exam.  Doppler studies of the umbilical arteries performed due to  fetal growth restriction showed a normal S/D ratio of 3.03.  There were no signs of absent or reversed end-diastolic flow.  A follow-up growth scan, biophysical profile, and umbilical  artery Doppler study was scheduled in 1 week. ----------------------------------------------------------------------                   Ma Rings, MD Electronically Signed Final Report   12/06/2019 03:18 pm ----------------------------------------------------------------------  [place] MFM UA CORD DOPPLER  Result Date: 11/29/2019 ----------------------------------------------------------------------  OBSTETRICS REPORT                       (Signed Final 11/29/2019 04:25 pm) ---------------------------------------------------------------------- Patient Info  ID #:       [id number]                          D.O.B.:  1986-11-07 (33 yrs)  Name:       Denise [name]                   Visit Date: 11/29/2019 03:43 pm ---------------------------------------------------------------------- Performed By  Performed By:     Magnus Ivan           Ref. Address:     Faculty                    RDMS, RVT  Attending:        Ma Rings MD         [facility]  Referred By:      Gigi Gin                     CONSTANT MD ---------------------------------------------------------------------- Orders   #  Description                          Code         Ordered By   1  [place] MFM UA CORD DOPPLER               69629.52     RAVI SHANKAR  ----------------------------------------------------------------------   #  Order #                    Accession #                 Episode #   1  [id number]                  [id number]                  [id number]  ---------------------------------------------------------------------- Indications   Maternal care for known or suspected poor      O36.5930   fetal growth, third trimester, not applicable or   unspecified IUGR   [redacted] weeks gestation of pregnancy                Z3A.30   Obesity complicating pregnancy, third          O99.213   trimester  (Pre Pregnancy BMI 37)   Hypertension - Chronic/Pre-existing (ASA)      O10.019   Poor obstetric history-Recurrent (habitual)    O26.20   abortion (3 consecutive ab's)   Fetal abnormality - other known or             O35.9XX0   suspected (Bilat CPC, EIFLV)   Maternal Clotting Disorder (taking Lovanox)   Uterine fibroids                               O34.10  ---------------------------------------------------------------------- Vital Signs                                                 Height:        5'4" ---------------------------------------------------------------------- Fetal Evaluation  Num Of Fetuses:         1  Fetal Heart Rate(bpm):  138  Cardiac Activity:       Observed  Presentation:           Breech  Placenta:               Right lateral  Amniotic Fluid  AFI FV:      Subjectively within normal limits ---------------------------------------------------------------------- OB History  Gravidity:    5          SAB:   3  Ectopic:      1        Living:  0 ---------------------------------------------------------------------- Gestational Age  LMP:           30w 2d        Date:  05/01/19                 EDD:   02/05/20  Best:  30w 2d     Det. By:  LMP  (05/01/19)          EDD:   02/05/20 ---------------------------------------------------------------------- Doppler - Fetal Vessels  Umbilical Artery   S/D     %tile     RI              PI                     ADFV    RDFV  5.08    > 97.5    0.8            1.41                        No      No ---------------------------------------------------------------------- Comments  This patient was seen due to an IUGR fetus.  She denies any  problems since her last exam.  The patient had a reactive nonstress test today.  There was normal amniotic fluid noted on today's ultrasound  exam.  Fetal movements were noted throughout today's  ultrasound exam.  Doppler studies of the umbilical arteries performed due to  fetal growth restriction showed an elevated S/D ratio of 5.08.  There were no signs of absent or reversed end-diastolic flow.  A follow-up exam was scheduled in 1 week. ----------------------------------------------------------------------                   Ma RingsVictor Fang, MD Electronically Signed Final Report   11/29/2019 04:25 pm ----------------------------------------------------------------------  US MFM UA CORD DOPPLER  Result Date: 11/22/2019 ----------------------------------------------------------------------  OBSTETRICS REPORT                       (Signed Final 11/22/2019 04:52 pm) ---------------------------------------------------------------------- Patient Info  ID #:       [id number]                          D.O.B.:  Sep 30, 1986 (33 yrs)  Name:       Denise [name]                   Visit Date: 11/22/2019 03:28 pm ---------------------------------------------------------------------- Performed By  Performed By:     Eden Lathearrie Stalter BS      Ref. Address:     Faculty                    RDMS RVT  Attending:        Noralee Spaceavi Shankar MD        [facility]  Referred By:      Gigi GinPEGGY                     CONSTANT MD ---------------------------------------------------------------------- Orders   #  Description                          Code         Ordered By   1  US MFM  UA CORD DOPPLER               76820.02     RAVI SHANKAR   2  US MFM OB FOLLOW UP                  [id number]     RAVI SHANKAR  ----------------------------------------------------------------------   #  Order #                    Accession #                 Episode #   1  [id number]                  [id number]   2  [id number]                  [id number]                  [id number]  ---------------------------------------------------------------------- Indications   Maternal care for known or suspected poor      O36.5930   fetal growth, third trimester, not applicable or   unspecified IUGR   Obesity complicating pregnancy, third          O99.213   trimester  (Pre Pregnancy BMI 37)   Encounter for other antenatal screening        Z36.2   follow-up (low risk NIPS)   Hypertension - Chronic/Pre-existing (ASA)      O10.019   Poor obstetric history-Recurrent (habitual)    O26.20   abortion (3 consecutive ab's)   Fetal abnormality - other known or             O35.9XX0   suspected (Bilat CPC, EIFLV)   Maternal Clotting Disorder (taking Lovanox)   Uterine fibroids                               O34.10   [redacted] weeks gestation of pregnancy                Z3A.29  ---------------------------------------------------------------------- Vital Signs                                                 Height:        5'4" ---------------------------------------------------------------------- Fetal Evaluation  Num Of Fetuses:         1  Fetal Heart Rate(bpm):  150  Cardiac Activity:       Observed  Presentation:           Cephalic  Placenta:               Anterior Fundal  P. Cord Insertion:      Visualized  Amniotic Fluid  AFI FV:      Within normal limits  AFI Sum(cm)     %Tile       Largest Pocket(cm)  12.3            32          3.9  RUQ(cm)       RLQ(cm)        LUQ(cm)        LLQ(cm)  3.42  3.58          3.9            1.4 ---------------------------------------------------------------------- Biometry  BPD:      80.2  mm     G. Age:  32w 1d         98  %    CI:         81.4   %    70 - 86                                                          FL/HC:      17.8   %    19.6 - 20.8  HC:      280.6  mm     G. Age:  30w 5d         60  %    HC/AC:      1.21        0.99 - 1.21  AC:      232.8  mm     G. Age:  27w 4d          7  %    FL/BPD:     62.3   %    71 - 87  FL:         50  mm     G. Age:  26w 6d          1  %    FL/AC:      21.5   %    20 - 24  HUM:      45.9  mm     G. Age:  27w 0d        < 5  %  Est. FW:    1150  gm      2 lb 9 oz      6  % ---------------------------------------------------------------------- OB History  Gravidity:    5          SAB:   3  Ectopic:      1        Living:  0 ---------------------------------------------------------------------- Gestational Age  LMP:           29w 2d        Date:  05/01/19                 EDD:   02/05/20  U/S Today:     29w 2d                                        EDD:   02/05/20  Best:          29w 2d     Det. By:  LMP  (05/01/19)          EDD:   02/05/20 ---------------------------------------------------------------------- Anatomy  Cavum:                 Appears normal         Aortic Arch:            Previously seen  Ventricles:            Appears normal  Ductal Arch:            Not well visualized  Choroid Plexus:        Resolved CPCs          Diaphragm:              Appears normal  Cerebellum:            Appears normal         Stomach:                Appears normal, left                                                                        sided  Posterior Fossa:       Previously seen        Abdomen:                Appears normal  Nuchal Fold:           Previously seen        Abdominal Wall:         Previously seen  Face:                  Appears normal         Cord Vessels:            Previously seen                         (orbits and profile)  Lips:                  Appears normal         Kidneys:                Appear normal  Palate:                Not well visualized    Bladder:                Appears normal  Thoracic:              Appears normal         Spine:                  Previously seen  Heart:                 Echogenic focus        Upper Extremities:      Previously seen                         previously seen  RVOT:                  Previously seen        Lower Extremities:      Previously seen  LVOT:                  Previously seen  Other:  Nasal bone visualized. Heels previously seen. Technically difficult          due to maternal habitus and fetal position. ---------------------------------------------------------------------- Doppler - Fetal Vessels  Umbilical Artery   S/D     %  tile                                            ADFV    RDFV  4.42       97                                                No      No ---------------------------------------------------------------------- Cervix Uterus Adnexa  Cervix  Not visualized (advanced GA >24wks)  Uterus  Single fibroid previously noted.  Left Ovary  Not visualized.  Right Ovary  Not visualized.  Cul De Sac  No free fluid seen.  Adnexa  No abnormality visualized. ---------------------------------------------------------------------- Impression  Fetal growth restriction.  The estimated fetal weight is at the 6 percentile (interval  weight gain 359 g).  Amniotic fluid is normal good fetal activity  seen.  Umbilical artery Doppler showed increased S/D ratio.  I discussed the significance of Doppler studies and antenatal  testing. ---------------------------------------------------------------------- Recommendations  -UA Doppler and NST next week. ----------------------------------------------------------------------                  Noralee Space, MD Electronically Signed Final Report   11/22/2019 04:52 pm  ----------------------------------------------------------------------  [place] MFM UA CORD DOPPLER  Result Date: 11/15/2019 ----------------------------------------------------------------------  OBSTETRICS REPORT                       (Signed Final 11/15/2019 11:45 am) ---------------------------------------------------------------------- Patient Info  ID #:       [id number]                          D.O.B.:  [birth year] (33 yrs)  Name:       Denise [name]                   Visit Date: 11/15/2019 10:46 am ---------------------------------------------------------------------- Performed By  Performed By:     Tommie Raymond BS,       Ref. Address:     Faculty                    RDMS, RVT  Attending:        Ma Rings MD         [facility]  Referred By:      Gigi Gin                    CONSTANT MD ---------------------------------------------------------------------- Orders   #  Description                          Code         Ordered By   1  [place] MFM UA CORD DOPPLER  21308.65     RAVI SHANKAR  ----------------------------------------------------------------------   #  Order #                    Accession #                 Episode #   1  [id number]                  [id number]                  [id number]  ---------------------------------------------------------------------- Indications   Maternal care for known or suspected poor      O36.5931   fetal growth, third trimester, fetus 1 IUGR   Obesity complicating pregnancy, third          O99.213   trimester  (Pre Pregnancy BMI 37)   [redacted] weeks gestation of pregnancy                Z3A.28   Encounter for other antenatal screening        Z36.2   follow-up (low risk NIPS)   Hypertension - Chronic/Pre-existing (ASA)      O10.019   Poor obstetric history-Recurrent (habitual)    O26.20   abortion (3 consecutive ab's)   Fetal abnormality - other known or             O35.9XX0    suspected (Bilat CPC, EIFLV)   Maternal Clotting Disorder (taking Lovanox)   Uterine fibroids                               O34.10  ---------------------------------------------------------------------- Vital Signs                                                 Height:        5'4" ---------------------------------------------------------------------- Fetal Evaluation  Num Of Fetuses:         1  Fetal Heart Rate(bpm):  153  Cardiac Activity:       Observed  Presentation:           Homero Fellers breech  Placenta:               Anterior Fundal  P. Cord Insertion:      Previously Visualized  Amniotic Fluid  AFI FV:      Within normal limits  AFI Sum(cm)     %Tile       Largest Pocket(cm)  16              58          4.61  RUQ(cm)       RLQ(cm)       LUQ(cm)        LLQ(cm)  3.37          4.35          3.67           4.61 ---------------------------------------------------------------------- Biometry  LV:        6.2  mm ---------------------------------------------------------------------- OB History  Gravidity:    5          SAB:   3  Ectopic:      1        Living:  0 ---------------------------------------------------------------------- Gestational Age  LMP:  28w 2d        Date:  05/01/19                 EDD:   02/05/20  Best:          Eden Emms 2d     Det. By:  LMP  (05/01/19)          EDD:   02/05/20 ---------------------------------------------------------------------- Anatomy  Cranium:               Previously seen        LVOT:                   Previously seen  Cavum:                 Previously seen        Aortic Arch:            Previously seen  Ventricles:            Appears normal         Ductal Arch:            Not well visualized  Choroid Plexus:        Resolved CPCs          Diaphragm:              Previously seen                         prev.  Cerebellum:            Previously seen        Stomach:                Previously Seen  Posterior Fossa:       Previously seen        Abdomen:                Previously  seen  Nuchal Fold:           Previously seen        Abdominal Wall:         Previously seen  Face:                  Orbits and profile     Cord Vessels:           Previously seen                         previously seen  Lips:                  Previously seen        Kidneys:                Previously seen  Palate:                Not well visualized    Bladder:                Previously seen  Thoracic:              Previously seen        Spine:                  Previously seen  Heart:                 Echogenic focus        Upper Extremities:  Previously seen                         previously seen  RVOT:                  Previously seen        Lower Extremities:      Previously seen  Other:  Female gender previously seen. Nasal bone previously seen. Heels          previously seen. Technically difficult due to maternal habitus and fetal          position. ---------------------------------------------------------------------- Doppler - Fetal Vessels  Umbilical Artery   S/D     %tile     RI              PI                     ADFV    RDFV  4.18       93   0.76             1.33                        No      No ---------------------------------------------------------------------- Cervix Uterus Adnexa  Cervix  Not visualized (advanced GA >24wks)  Uterus  Single fibroid noted, see table below.  Left Ovary  Within normal limits. No adnexal mass visualized.  Right Ovary  Within normal limits. No adnexal mass visualized.  Cul De Sac  No free fluid seen.  Adnexa  No abnormality visualized. ---------------------------------------------------------------------- Myomas   Site                     L(cm)      W(cm)      D(cm)      Location   Fundus Left              4.51       4.46       3.82       Intramural  ----------------------------------------------------------------------   Blood Flow                 RI        PI       Comments  ---------------------------------------------------------------------- Comments  This patient  was seen due to an IUGR fetus.  She denies any  problems since her last exam.  There was normal amniotic fluid noted on today's ultrasound  exam.  Fetal movements and fetal breathing movements  were also noted throughout today's exam.  Doppler studies of the umbilical arteries performed due to  fetal growth restriction showed an elevated S/D ratio of 4.18.  A follow-up exam was scheduled in 1 week. ----------------------------------------------------------------------                   Ma Rings, MD Electronically Signed Final Report   11/15/2019 11:45 am ----------------------------------------------------------------------   Assessment and Plan:  Pregnancy: G5P0040 at [redacted]w[redacted]d 1. Rh negative, antepartum S/P Rhogam  2. Chronic hypertension affecting pregnancy BP stable without meds Continue with qd BASA  3. Supervision of high risk pregnancy, antepartum Stable  4. Recurrent pregnancy loss Continue with Lovenox  5.IUGR Continue with weekly BPP and doppler  Preterm labor symptoms and general obstetric precautions including but not limited to vaginal bleeding, contractions, leaking of fluid and fetal movement were reviewed in detail with the patient. I discussed the assessment  and treatment plan with the patient. The patient was provided an opportunity to ask questions and all were answered. The patient agreed with the plan and demonstrated an understanding of the instructions. The patient was advised to call back or seek an in-person office evaluation/go to [facility]U at [facility] for any urgent or concerning symptoms. Please refer to After Visit Summary for other counseling recommendations.   I provided 8 minutes of face-to-face time during this encounter.  Return in about 2 weeks (around 12/25/2019) for OB visit, virtual, MD provider.  Future Appointments  Date Time Provider [place]  12/13/2019  2:10 PM [place] [facility]  12/13/2019  2:15 PM [place] [facility]  [facility]  12/13/2019  3:15 PM [place]    Chancy Milroy, [place] for [facility], [place]

## 2019-12-11 NOTE — Progress Notes (Signed)
Pt is scheduled for u/s this week. Pt has no complaints.

## 2019-12-13 ENCOUNTER — Ambulatory Visit (HOSPITAL_COMMUNITY): Payer: BC Managed Care – PPO | Admitting: *Deleted

## 2019-12-13 ENCOUNTER — Ambulatory Visit (HOSPITAL_COMMUNITY)
Admission: RE | Admit: 2019-12-13 | Discharge: 2019-12-13 | Disposition: A | Discharge status: Home / Self Care | Payer: BC Managed Care – PPO | Source: Ambulatory Visit | Attending: Obstetrics and Gynecology | Admitting: Obstetrics and Gynecology

## 2019-12-13 ENCOUNTER — Encounter (HOSPITAL_COMMUNITY): Payer: Self-pay

## 2019-12-13 ENCOUNTER — Other Ambulatory Visit: Payer: Self-pay

## 2019-12-13 DIAGNOSIS — O10919 Unspecified pre-existing hypertension complicating pregnancy, unspecified trimester: Secondary | ICD-10-CM

## 2019-12-13 DIAGNOSIS — O2623 Pregnancy care for patient with recurrent pregnancy loss, third trimester: Secondary | ICD-10-CM | POA: Diagnosis not present

## 2019-12-13 DIAGNOSIS — O99213 Obesity complicating pregnancy, third trimester: Secondary | ICD-10-CM | POA: Diagnosis not present

## 2019-12-13 DIAGNOSIS — O10013 Pre-existing essential hypertension complicating pregnancy, third trimester: Secondary | ICD-10-CM | POA: Diagnosis not present

## 2019-12-13 DIAGNOSIS — Z3A32 32 weeks gestation of pregnancy: Secondary | ICD-10-CM

## 2019-12-13 DIAGNOSIS — Z362 Encounter for other antenatal screening follow-up: Secondary | ICD-10-CM | POA: Diagnosis not present

## 2019-12-13 DIAGNOSIS — O365930 Maternal care for other known or suspected poor fetal growth, third trimester, not applicable or unspecified: Secondary | ICD-10-CM | POA: Diagnosis present

## 2019-12-13 NOTE — Procedures (Signed)
Denise [name] 03-02-1986 [redacted]w[redacted]d  Fetus A Non-Stress Test Interpretation for 12/13/19  Indication: IUGR  Fetal Heart Rate A Mode: External Baseline Rate (A): 135 bpm Variability: Moderate Accelerations: 15 x 15 Decelerations: None Multiple birth?: No  Uterine Activity Mode: Toco Contraction Frequency (min): one UC noted Contraction Duration (sec): 50 Contraction Quality: Mild Resting Tone Palpated: Relaxed Resting Time: Adequate  Interpretation (Fetal Testing) Nonstress Test Interpretation: Reactive Comments: FHR tracing rev'd by Dr. Parke Poisson

## 2019-12-14 ENCOUNTER — Other Ambulatory Visit (HOSPITAL_COMMUNITY): Payer: Self-pay | Admitting: *Deleted

## 2019-12-14 DIAGNOSIS — O365930 Maternal care for other known or suspected poor fetal growth, third trimester, not applicable or unspecified: Secondary | ICD-10-CM

## 2019-12-21 ENCOUNTER — Encounter (HOSPITAL_COMMUNITY): Payer: Self-pay | Admitting: *Deleted

## 2019-12-21 ENCOUNTER — Other Ambulatory Visit: Payer: Self-pay

## 2019-12-21 ENCOUNTER — Ambulatory Visit (HOSPITAL_COMMUNITY): Payer: BC Managed Care – PPO | Admitting: *Deleted

## 2019-12-21 ENCOUNTER — Ambulatory Visit (HOSPITAL_COMMUNITY)
Admission: RE | Admit: 2019-12-21 | Discharge: 2019-12-21 | Disposition: A | Discharge status: Home / Self Care | Payer: BC Managed Care – PPO | Source: Ambulatory Visit | Attending: Obstetrics and Gynecology | Admitting: Obstetrics and Gynecology

## 2019-12-21 VITALS — BP 135/83

## 2019-12-21 DIAGNOSIS — O10013 Pre-existing essential hypertension complicating pregnancy, third trimester: Secondary | ICD-10-CM

## 2019-12-21 DIAGNOSIS — O10919 Unspecified pre-existing hypertension complicating pregnancy, unspecified trimester: Secondary | ICD-10-CM | POA: Insufficient documentation

## 2019-12-21 DIAGNOSIS — O2623 Pregnancy care for patient with recurrent pregnancy loss, third trimester: Secondary | ICD-10-CM | POA: Diagnosis not present

## 2019-12-21 DIAGNOSIS — O359XX0 Maternal care for (suspected) fetal abnormality and damage, unspecified, not applicable or unspecified: Secondary | ICD-10-CM

## 2019-12-21 DIAGNOSIS — O99213 Obesity complicating pregnancy, third trimester: Secondary | ICD-10-CM | POA: Diagnosis not present

## 2019-12-21 DIAGNOSIS — O365990 Maternal care for other known or suspected poor fetal growth, unspecified trimester, not applicable or unspecified: Secondary | ICD-10-CM | POA: Diagnosis present

## 2019-12-21 DIAGNOSIS — Z3A33 33 weeks gestation of pregnancy: Secondary | ICD-10-CM

## 2019-12-21 DIAGNOSIS — O365930 Maternal care for other known or suspected poor fetal growth, third trimester, not applicable or unspecified: Secondary | ICD-10-CM

## 2019-12-21 NOTE — Procedures (Signed)
Denise [name] [birth year] [redacted]w[redacted]d  Fetus A Non-Stress Test Interpretation for 12/21/19  Indication: IUGR, CHTN  Fetal Heart Rate A Mode: External Baseline Rate (A): 135 bpm Variability: Moderate Accelerations: 15 x 15 Decelerations: None Multiple birth?: No  Uterine Activity Mode: Palpation, Toco Contraction Frequency (min): Occas Contraction Quality: Mild Resting Tone Palpated: Relaxed Resting Time: Adequate  Interpretation (Fetal Testing) Nonstress Test Interpretation: Reactive Comments: EFM tracing reviewed by Dr. Parke Poisson

## 2019-12-24 ENCOUNTER — Other Ambulatory Visit (HOSPITAL_COMMUNITY): Payer: BC Managed Care – PPO

## 2019-12-25 ENCOUNTER — Telehealth: Payer: BC Managed Care – PPO | Admitting: Obstetrics and Gynecology

## 2019-12-28 ENCOUNTER — Ambulatory Visit (HOSPITAL_BASED_OUTPATIENT_CLINIC_OR_DEPARTMENT_OTHER)
Admission: RE | Admit: 2019-12-28 | Discharge: 2019-12-28 | Disposition: A | Discharge status: Home / Self Care | Payer: BC Managed Care – PPO | Source: Ambulatory Visit | Attending: Obstetrics and Gynecology | Admitting: Obstetrics and Gynecology

## 2019-12-28 ENCOUNTER — Encounter (HOSPITAL_COMMUNITY): Payer: Self-pay | Admitting: Obstetrics & Gynecology

## 2019-12-28 ENCOUNTER — Inpatient Hospital Stay (HOSPITAL_COMMUNITY)
Admission: AD | Admit: 2019-12-28 | Discharge: 2020-01-01 | DRG: 787 | Disposition: A | Discharge status: Home / Self Care | Payer: BC Managed Care – PPO | Attending: Obstetrics and Gynecology | Admitting: Obstetrics and Gynecology

## 2019-12-28 ENCOUNTER — Ambulatory Visit (HOSPITAL_COMMUNITY): Payer: BC Managed Care – PPO | Admitting: *Deleted

## 2019-12-28 ENCOUNTER — Encounter (HOSPITAL_COMMUNITY): Payer: Self-pay

## 2019-12-28 ENCOUNTER — Other Ambulatory Visit: Payer: Self-pay

## 2019-12-28 DIAGNOSIS — O26893 Other specified pregnancy related conditions, third trimester: Secondary | ICD-10-CM | POA: Diagnosis present

## 2019-12-28 DIAGNOSIS — O10919 Unspecified pre-existing hypertension complicating pregnancy, unspecified trimester: Secondary | ICD-10-CM

## 2019-12-28 DIAGNOSIS — Z20822 Contact with and (suspected) exposure to covid-19: Secondary | ICD-10-CM | POA: Diagnosis present

## 2019-12-28 DIAGNOSIS — O365930 Maternal care for other known or suspected poor fetal growth, third trimester, not applicable or unspecified: Secondary | ICD-10-CM

## 2019-12-28 DIAGNOSIS — Z98891 History of uterine scar from previous surgery: Secondary | ICD-10-CM

## 2019-12-28 DIAGNOSIS — D62 Acute posthemorrhagic anemia: Secondary | ICD-10-CM | POA: Diagnosis not present

## 2019-12-28 DIAGNOSIS — O9902 Anemia complicating childbirth: Secondary | ICD-10-CM | POA: Diagnosis present

## 2019-12-28 DIAGNOSIS — O114 Pre-existing hypertension with pre-eclampsia, complicating childbirth: Secondary | ICD-10-CM | POA: Diagnosis present

## 2019-12-28 DIAGNOSIS — Z7901 Long term (current) use of anticoagulants: Secondary | ICD-10-CM | POA: Diagnosis not present

## 2019-12-28 DIAGNOSIS — Z6791 Unspecified blood type, Rh negative: Secondary | ICD-10-CM | POA: Diagnosis not present

## 2019-12-28 DIAGNOSIS — O1414 Severe pre-eclampsia complicating childbirth: Secondary | ICD-10-CM | POA: Diagnosis present

## 2019-12-28 DIAGNOSIS — O360930 Maternal care for other rhesus isoimmunization, third trimester, not applicable or unspecified: Secondary | ICD-10-CM | POA: Diagnosis not present

## 2019-12-28 DIAGNOSIS — Z3A34 34 weeks gestation of pregnancy: Secondary | ICD-10-CM

## 2019-12-28 DIAGNOSIS — O26899 Other specified pregnancy related conditions, unspecified trimester: Secondary | ICD-10-CM

## 2019-12-28 DIAGNOSIS — N96 Recurrent pregnancy loss: Secondary | ICD-10-CM | POA: Diagnosis present

## 2019-12-28 DIAGNOSIS — O4593 Premature separation of placenta, unspecified, third trimester: Secondary | ICD-10-CM | POA: Diagnosis present

## 2019-12-28 DIAGNOSIS — O0990 Supervision of high risk pregnancy, unspecified, unspecified trimester: Secondary | ICD-10-CM

## 2019-12-28 DIAGNOSIS — O1002 Pre-existing essential hypertension complicating childbirth: Secondary | ICD-10-CM | POA: Diagnosis present

## 2019-12-28 DIAGNOSIS — O9081 Anemia of the puerperium: Secondary | ICD-10-CM | POA: Diagnosis present

## 2019-12-28 HISTORY — DX: Other complications of anesthesia, initial encounter: T88.59XA

## 2019-12-28 HISTORY — DX: Malignant hyperthermia due to anesthesia, initial encounter: T88.3XXA

## 2019-12-28 LAB — URINALYSIS, ROUTINE W REFLEX MICROSCOPIC
Bilirubin Urine: NEGATIVE
Glucose, UA: NEGATIVE mg/dL
Hgb urine dipstick: NEGATIVE
Ketones, ur: NEGATIVE mg/dL
Nitrite: NEGATIVE
Protein, ur: 100 mg/dL — AB
Specific Gravity, Urine: 1.018 (ref 1.005–1.030)
pH: 6 (ref 5.0–8.0)

## 2019-12-28 LAB — PROTEIN / CREATININE RATIO, URINE
Creatinine, Urine: 195.86 mg/dL
Protein Creatinine Ratio: 0.41 mg/mg{creat} — ABNORMAL HIGH (ref 0.00–0.15)
Total Protein, Urine: 80 mg/dL

## 2019-12-28 LAB — COMPREHENSIVE METABOLIC PANEL
ALT: 16 U/L (ref 0–44)
AST: 24 U/L (ref 15–41)
Albumin: 2.5 g/dL — ABNORMAL LOW (ref 3.5–5.0)
Alkaline Phosphatase: 95 U/L (ref 38–126)
Anion gap: 10 (ref 5–15)
BUN: 9 mg/dL (ref 6–20)
CO2: 20 mmol/L — ABNORMAL LOW (ref 22–32)
Calcium: 8.9 mg/dL (ref 8.9–10.3)
Chloride: 109 mmol/L (ref 98–111)
Creatinine, Ser: 0.88 mg/dL (ref 0.44–1.00)
GFR calc Af Amer: >60 mL/min (ref >60)
GFR calc non Af Amer: >60 mL/min (ref >60)
Glucose, Bld: 89 mg/dL (ref 70–99)
Potassium: 3.9 mmol/L (ref 3.5–5.1)
Sodium: 139 mmol/L (ref 135–145)
Total Bilirubin: 0.4 mg/dL (ref 0.3–1.2)
Total Protein: 6 g/dL — ABNORMAL LOW (ref 6.5–8.1)

## 2019-12-28 LAB — CBC
HCT: 34.6 % — ABNORMAL LOW (ref 36.0–46.0)
Hemoglobin: 11 g/dL — ABNORMAL LOW (ref 12.0–15.0)
MCH: 27 pg (ref 26.0–34.0)
MCHC: 31.8 g/dL (ref 30.0–36.0)
MCV: 85 fL (ref 80.0–100.0)
Platelets: 158 10*3/uL (ref 150–400)
RBC: 4.07 MIL/uL (ref 3.87–5.11)
RDW: 14.6 % (ref 11.5–15.5)
WBC: 10.1 10*3/uL (ref 4.0–10.5)
nRBC: 0 % (ref 0.0–0.2)

## 2019-12-28 LAB — GROUP B STREP BY PCR: Group B strep by PCR: NEGATIVE

## 2019-12-28 MED ORDER — LACTATED RINGERS IV SOLN: 500.0000 mL | INTRAVENOUS | Status: DC | PRN

## 2019-12-28 MED ORDER — OXYCODONE-ACETAMINOPHEN 5-325 MG PO TABS: 2.0000 | ORAL_TABLET | ORAL | Status: DC | PRN

## 2019-12-28 MED ORDER — LABETALOL HCL 5 MG/ML IV SOLN
40.0000 mg | INTRAVENOUS | Status: DC | PRN
  Administered 2019-12-28: 40 mg via INTRAVENOUS
  Filled 2019-12-28: qty 8

## 2019-12-28 MED ORDER — SODIUM CHLORIDE 0.9 % IV SOLN
5.0000 10*6.[IU] | Freq: Once | INTRAVENOUS | Status: AC
  Administered 2019-12-29: 5 10*6.[IU] via INTRAVENOUS
  Filled 2019-12-28: qty 5

## 2019-12-28 MED ORDER — HYDRALAZINE HCL 20 MG/ML IJ SOLN: 10.0000 mg | INTRAMUSCULAR | Status: DC | PRN

## 2019-12-28 MED ORDER — LACTATED RINGERS IV SOLN: INTRAVENOUS | Status: DC

## 2019-12-28 MED ORDER — ACETAMINOPHEN 325 MG PO TABS: 650.0000 mg | ORAL_TABLET | ORAL | Status: DC | PRN

## 2019-12-28 MED ORDER — OXYTOCIN BOLUS FROM INFUSION: 500.0000 mL | Freq: Once | INTRAVENOUS | Status: DC

## 2019-12-28 MED ORDER — LABETALOL HCL 5 MG/ML IV SOLN: 80.0000 mg | INTRAVENOUS | Status: DC | PRN

## 2019-12-28 MED ORDER — TERBUTALINE SULFATE 1 MG/ML IJ SOLN
0.2500 mg | Freq: Once | INTRAMUSCULAR | Status: DC | PRN
  Filled 2019-12-28: qty 1

## 2019-12-28 MED ORDER — MISOPROSTOL 25 MCG QUARTER TABLET
25.0000 ug | ORAL_TABLET | ORAL | Status: DC | PRN
  Administered 2019-12-29: 25 ug via VAGINAL
  Filled 2019-12-28: qty 1

## 2019-12-28 MED ORDER — LABETALOL HCL 5 MG/ML IV SOLN
20.0000 mg | INTRAVENOUS | Status: DC | PRN
  Administered 2019-12-28: 20 mg via INTRAVENOUS
  Filled 2019-12-28: qty 4

## 2019-12-28 MED ORDER — BETAMETHASONE SOD PHOS & ACET 6 (3-3) MG/ML IJ SUSP
12.0000 mg | INTRAMUSCULAR | Status: DC
  Administered 2019-12-28: 12 mg via INTRAMUSCULAR
  Filled 2019-12-28: qty 5

## 2019-12-28 MED ORDER — OXYCODONE-ACETAMINOPHEN 5-325 MG PO TABS: 1.0000 | ORAL_TABLET | ORAL | Status: DC | PRN

## 2019-12-28 MED ORDER — LABETALOL HCL 5 MG/ML IV SOLN: 40.0000 mg | INTRAVENOUS | Status: DC | PRN

## 2019-12-28 MED ORDER — LIDOCAINE HCL (PF) 1 % IJ SOLN: 30.0000 mL | INTRAMUSCULAR | Status: DC | PRN

## 2019-12-28 MED ORDER — OXYTOCIN 40 UNITS IN NORMAL SALINE INFUSION - SIMPLE MED: 2.5000 [IU]/h | INTRAVENOUS | Status: DC

## 2019-12-28 MED ORDER — ONDANSETRON HCL 4 MG/2ML IJ SOLN
4.0000 mg | Freq: Four times a day (QID) | INTRAMUSCULAR | Status: DC | PRN
  Administered 2019-12-29: 4 mg via INTRAVENOUS
  Filled 2019-12-28: qty 2

## 2019-12-28 MED ORDER — SOD CITRATE-CITRIC ACID 500-334 MG/5ML PO SOLN
30.0000 mL | ORAL | Status: DC | PRN
  Administered 2019-12-29: 30 mL via ORAL
  Filled 2019-12-28: qty 30

## 2019-12-28 MED ORDER — PENICILLIN G POT IN DEXTROSE 60000 UNIT/ML IV SOLN
3.0000 10*6.[IU] | INTRAVENOUS | Status: DC
  Administered 2019-12-29 (×2): 3 10*6.[IU] via INTRAVENOUS
  Filled 2019-12-28 (×2): qty 50

## 2019-12-28 MED ORDER — LABETALOL HCL 5 MG/ML IV SOLN: 20.0000 mg | INTRAVENOUS | Status: DC | PRN

## 2019-12-28 MED ORDER — MAGNESIUM SULFATE BOLUS VIA INFUSION
4.0000 g | Freq: Once | INTRAVENOUS | Status: AC
  Administered 2019-12-29: 4 g via INTRAVENOUS
  Filled 2019-12-28: qty 1000

## 2019-12-28 MED ORDER — MAGNESIUM SULFATE 40 GM/1000ML IV SOLN
2.0000 g/h | INTRAVENOUS | Status: DC
  Administered 2019-12-29: 2 g/h via INTRAVENOUS
  Filled 2019-12-28: qty 1000

## 2019-12-28 NOTE — Procedures (Signed)
Denise [name] [birth year] [redacted]w[redacted]d  Fetus A Non-Stress Test Interpretation for 12/28/19  Indication: IUGR  Fetal Heart Rate A Mode: External Baseline Rate (A): 145 bpm Variability: Moderate Accelerations: 15 x 15 Decelerations: None Multiple birth?: No  Uterine Activity Mode: Toco Contraction Frequency (min): one UC noted Contraction Duration (sec): 50 Contraction Quality: Mild Resting Tone Palpated: Relaxed Resting Time: Adequate  Interpretation (Fetal Testing) Nonstress Test Interpretation: Reactive Comments: FHR tracing rev'd by Dr. Judeth Cornfield

## 2019-12-28 NOTE — Progress Notes (Signed)
Report called to MAU. Pt to go to MAU for BP evaluation.

## 2019-12-28 NOTE — MAU Provider Note (Signed)
History    CSN: [id number]  Arrival date and time: 12/28/19 1821   First Provider Initiated Contact with Patient 12/28/19 1954      Chief Complaint  Patient presents with  . Hypertension   HPI  Patient Denise [name] is a 34 y.o. P1W2585  at 39w3dhere after having elevated blood pressures at her MFM appointment today. She denies vaginal discharge, vaginal bleeding, decreased fetal movements. She reports that he moved a lot last night at his usual time but felt like he has moved less today.   She denies HA, blurry vision, floating spots, RUQ pain. She has a pregnancy complicated by SGA and questionable Chronic hypertension. She reports that her BPs were always elevated at early OB visits due to anxiety but then second BP reading would be normal.  She has not been on any medication for blood pressure now or in the past. Her PCP (per patient) has never diagnosed her with blood pressure issues.    OB History    Gravida  5   Para      Term      Preterm      AB  4   Living  0     SAB  3   TAB      Ectopic  1   Multiple      Live Births           Obstetric Comments  One adopted daughter         Past Medical History:  Diagnosis Date  . Kidney stone   . Migraine     Past Surgical History:  Procedure Laterality Date  . KIDNEY STONE SURGERY  2014   L and R     Family History  Problem Relation Age of Onset  . Endometrial cancer Mother   . Breast cancer Maternal Grandmother   . Skin cancer Maternal Grandmother   . Lung cancer Maternal Grandmother   . Stroke Maternal Grandfather   . Endometrial cancer Sister     Social History   Tobacco Use  . Smoking status: Never Smoker  . Smokeless tobacco: Never Used  Substance Use Topics  . Alcohol use: Not Currently    Comment: occ  . Drug use: No    Allergies: No Known Allergies  Medications Prior to Admission  Medication Sig Dispense Refill Last Dose  . aspirin EC 81 MG tablet Take 1 tablet (81 mg  total) by mouth daily. Take after 12 weeks for prevention of preeclampsia later in pregnancy 300 tablet 2 12/28/2019 at Unknown time  . enoxaparin (LOVENOX) 40 MG/0.4ML injection Inject 0.4 mLs (40 mg total) into the skin daily. 12 mL 6 12/28/2019 at Unknown time  . Prenatal Vit-Fe Fumarate-FA (PRENATAL PO) Take by mouth.   12/28/2019 at Unknown time  . Blood Pressure Monitoring (BLOOD PRESSURE KIT) DEVI 1 Device by Does not apply route as needed. 1 Device 0     Review of Systems  Constitutional: Negative.   HENT: Negative.   Respiratory: Negative.   Cardiovascular: Negative.   Gastrointestinal: Negative.   Neurological: Negative.   Psychiatric/Behavioral: Negative.    Physical Exam   Blood pressure (!) 150/86, pulse 74, temperature 98.2 F (36.8 C), resp. rate 18, height 5' 4" (1.626 m), weight 111.8 kg, last menstrual period 05/01/2019.  Physical Exam  Constitutional: She is oriented to person, place, and time. She appears well-developed.  HENT:  Head: Normocephalic.  Eyes: Pupils are equal, round, and reactive to light.  Respiratory: Effort normal.  GI: Soft. Bowel sounds are normal.  Musculoskeletal:        General: Normal range of motion.     Cervical back: Normal range of motion.  Neurological: She is alert and oriented to person, place, and time.  Skin: Skin is warm.    MAU Course  Procedures  MDM -labetalol 20 mg IV push given at 2004 after elevated BPs in MAU. Patient asymptomatic; labs collected.   -CMP and CBC are normal, protein creatine is elevated.   Patient Vitals for the past 24 hrs:  BP Temp Pulse Resp Height Weight  12/28/19 2116 (!) 150/86 - 74 - - -  12/28/19 2101 (!) 142/79 - 69 - - -  12/28/19 2046 137/85 - 72 - - -  12/28/19 2031 140/87 - 78 - - -  12/28/19 2016 (!) 153/95 - 74 - - -  12/28/19 2001 (!) 154/91 - 72 - - -  12/28/19 1946 (!) 162/94 - 71 - - -  12/28/19 1938 (!) 163/98 - 75 - - -  12/28/19 1916 (!) 164/97 - 69 - - -  12/28/19 1900  (!) 166/99 98.2 F (36.8 C) 71 18 - -  12/28/19 1846 - - - - 5' 4" (1.626 m) 111.8 kg    Blood pressure responded well to IV labetalol, however are now beginning to rise after labetalol.   Assessment and Plan  Discussed with Dr. Dove, who recommends admission and has approval from NICU for admission.   Kathryn Lorraine Kooistra 12/28/2019, 9:45 PM  

## 2019-12-29 ENCOUNTER — Encounter (HOSPITAL_COMMUNITY)
Admission: AD | Disposition: A | Discharge status: Home / Self Care | Payer: Self-pay | Source: Ambulatory Visit | Attending: Obstetrics and Gynecology

## 2019-12-29 ENCOUNTER — Inpatient Hospital Stay (HOSPITAL_COMMUNITY): Payer: BC Managed Care – PPO | Admitting: Anesthesiology

## 2019-12-29 ENCOUNTER — Encounter (HOSPITAL_COMMUNITY): Payer: Self-pay | Admitting: Student

## 2019-12-29 DIAGNOSIS — Z3A34 34 weeks gestation of pregnancy: Secondary | ICD-10-CM

## 2019-12-29 DIAGNOSIS — O360930 Maternal care for other rhesus isoimmunization, third trimester, not applicable or unspecified: Secondary | ICD-10-CM

## 2019-12-29 DIAGNOSIS — O1002 Pre-existing essential hypertension complicating childbirth: Secondary | ICD-10-CM

## 2019-12-29 LAB — COMPREHENSIVE METABOLIC PANEL
ALT: 16 U/L (ref 0–44)
AST: 21 U/L (ref 15–41)
Albumin: 2.8 g/dL — ABNORMAL LOW (ref 3.5–5.0)
Alkaline Phosphatase: 99 U/L (ref 38–126)
Anion gap: 11 (ref 5–15)
BUN: 8 mg/dL (ref 6–20)
CO2: 17 mmol/L — ABNORMAL LOW (ref 22–32)
Calcium: 8.1 mg/dL — ABNORMAL LOW (ref 8.9–10.3)
Chloride: 107 mmol/L (ref 98–111)
Creatinine, Ser: 0.8 mg/dL (ref 0.44–1.00)
GFR calc Af Amer: >60 mL/min (ref >60)
GFR calc non Af Amer: >60 mL/min (ref >60)
Glucose, Bld: 114 mg/dL — ABNORMAL HIGH (ref 70–99)
Potassium: 5 mmol/L (ref 3.5–5.1)
Sodium: 135 mmol/L (ref 135–145)
Total Bilirubin: 0.5 mg/dL (ref 0.3–1.2)
Total Protein: 6.1 g/dL — ABNORMAL LOW (ref 6.5–8.1)

## 2019-12-29 LAB — CBC
HCT: 33.4 % — ABNORMAL LOW (ref 36.0–46.0)
Hemoglobin: 10.9 g/dL — ABNORMAL LOW (ref 12.0–15.0)
MCH: 27 pg (ref 26.0–34.0)
MCHC: 32.6 g/dL (ref 30.0–36.0)
MCV: 82.7 fL (ref 80.0–100.0)
Platelets: 179 10*3/uL (ref 150–400)
RBC: 4.04 MIL/uL (ref 3.87–5.11)
RDW: 14.7 % (ref 11.5–15.5)
WBC: 14.2 10*3/uL — ABNORMAL HIGH (ref 4.0–10.5)
nRBC: 0 % (ref 0.0–0.2)

## 2019-12-29 LAB — RPR: RPR Ser Ql: NONREACTIVE

## 2019-12-29 LAB — SARS CORONAVIRUS 2 (TAT 6-24 HRS): SARS Coronavirus 2: NEGATIVE

## 2019-12-29 LAB — MAGNESIUM: Magnesium: 5.3 mg/dL — ABNORMAL HIGH (ref 1.7–2.4)

## 2019-12-29 SURGERY — Surgical Case
Anesthesia: General

## 2019-12-29 MED ORDER — MAGNESIUM SULFATE 40 GM/1000ML IV SOLN
2.0000 g/h | INTRAVENOUS | Status: AC
  Administered 2019-12-29: 2 g/h via INTRAVENOUS
  Filled 2019-12-29: qty 1000

## 2019-12-29 MED ORDER — COCONUT OIL OIL
1.0000 "application " | TOPICAL_OIL | Status: DC | PRN
  Administered 2019-12-30: 1 via TOPICAL

## 2019-12-29 MED ORDER — SIMETHICONE 80 MG PO CHEW: 80.0000 mg | CHEWABLE_TABLET | Freq: Three times a day (TID) | ORAL | Status: DC

## 2019-12-29 MED ORDER — TETANUS-DIPHTH-ACELL PERTUSSIS 5-2.5-18.5 LF-MCG/0.5 IM SUSP: 0.5000 mL | Freq: Once | INTRAMUSCULAR | Status: DC

## 2019-12-29 MED ORDER — HYDROMORPHONE HCL 1 MG/ML IJ SOLN
INTRAMUSCULAR | Status: AC
  Filled 2019-12-29: qty 0.5

## 2019-12-29 MED ORDER — ZOLPIDEM TARTRATE 5 MG PO TABS
5.0000 mg | ORAL_TABLET | Freq: Every evening | ORAL | Status: DC | PRN
  Administered 2019-12-29: 5 mg via ORAL
  Filled 2019-12-29: qty 1

## 2019-12-29 MED ORDER — SODIUM CHLORIDE 0.9 % IV SOLN
500.0000 mg | Freq: Once | INTRAVENOUS | Status: AC
  Administered 2019-12-29: 500 mg via INTRAVENOUS

## 2019-12-29 MED ORDER — KETOROLAC TROMETHAMINE 30 MG/ML IJ SOLN
INTRAMUSCULAR | Status: AC
  Filled 2019-12-29: qty 1

## 2019-12-29 MED ORDER — TERBUTALINE SULFATE 1 MG/ML IJ SOLN: 0.2500 mg | Freq: Once | INTRAMUSCULAR | Status: DC | PRN

## 2019-12-29 MED ORDER — PROPOFOL 10 MG/ML IV BOLUS
INTRAVENOUS | Status: AC
  Filled 2019-12-29: qty 40

## 2019-12-29 MED ORDER — MAGNESIUM SULFATE 40 GM/1000ML IV SOLN: 2.0000 g/h | INTRAVENOUS | Status: DC

## 2019-12-29 MED ORDER — OXYCODONE HCL 5 MG PO TABS: 5.0000 mg | ORAL_TABLET | ORAL | Status: DC | PRN

## 2019-12-29 MED ORDER — SIMETHICONE 80 MG PO CHEW
80.0000 mg | CHEWABLE_TABLET | Freq: Three times a day (TID) | ORAL | Status: DC
  Administered 2019-12-30 – 2020-01-01 (×8): 80 mg via ORAL
  Filled 2019-12-29 (×8): qty 1

## 2019-12-29 MED ORDER — HYDROMORPHONE HCL 1 MG/ML IJ SOLN
0.2500 mg | INTRAMUSCULAR | Status: DC | PRN
  Administered 2019-12-29 (×2): 0.5 mg via INTRAVENOUS

## 2019-12-29 MED ORDER — ROCURONIUM BROMIDE 100 MG/10ML IV SOLN
INTRAVENOUS | Status: DC | PRN
  Administered 2019-12-29: 120 mg via INTRAVENOUS

## 2019-12-29 MED ORDER — ENOXAPARIN SODIUM 40 MG/0.4ML ~~LOC~~ SOLN: 40.0000 mg | SUBCUTANEOUS | Status: DC

## 2019-12-29 MED ORDER — SENNOSIDES-DOCUSATE SODIUM 8.6-50 MG PO TABS
2.0000 | ORAL_TABLET | ORAL | Status: DC
  Administered 2019-12-29 – 2020-01-01 (×3): 2 via ORAL
  Filled 2019-12-29 (×3): qty 2

## 2019-12-29 MED ORDER — KETOROLAC TROMETHAMINE 30 MG/ML IJ SOLN
30.0000 mg | Freq: Four times a day (QID) | INTRAMUSCULAR | Status: AC
  Administered 2019-12-29 – 2019-12-30 (×3): 30 mg via INTRAVENOUS
  Filled 2019-12-29 (×3): qty 1

## 2019-12-29 MED ORDER — LIDOCAINE HCL (CARDIAC) PF 100 MG/5ML IV SOSY
PREFILLED_SYRINGE | INTRAVENOUS | Status: DC | PRN
  Administered 2019-12-29: 100 mg via INTRAVENOUS

## 2019-12-29 MED ORDER — IBUPROFEN 800 MG PO TABS
800.0000 mg | ORAL_TABLET | Freq: Four times a day (QID) | ORAL | Status: DC
  Administered 2019-12-30 – 2020-01-01 (×8): 800 mg via ORAL
  Filled 2019-12-29 (×8): qty 1

## 2019-12-29 MED ORDER — LACTATED RINGERS IV SOLN: INTRAVENOUS | Status: DC

## 2019-12-29 MED ORDER — MENTHOL 3 MG MT LOZG: 1.0000 | LOZENGE | OROMUCOSAL | Status: DC | PRN

## 2019-12-29 MED ORDER — LABETALOL HCL 200 MG PO TABS
200.0000 mg | ORAL_TABLET | Freq: Two times a day (BID) | ORAL | Status: DC
  Administered 2019-12-29 (×2): 200 mg via ORAL
  Filled 2019-12-29 (×2): qty 1

## 2019-12-29 MED ORDER — PROPOFOL 500 MG/50ML IV EMUL
INTRAVENOUS | Status: AC
  Filled 2019-12-29: qty 50

## 2019-12-29 MED ORDER — OXYTOCIN 40 UNITS IN NORMAL SALINE INFUSION - SIMPLE MED
INTRAVENOUS | Status: DC | PRN
  Administered 2019-12-29: 500 mL via INTRAVENOUS

## 2019-12-29 MED ORDER — KETOROLAC TROMETHAMINE 30 MG/ML IJ SOLN: 30.0000 mg | Freq: Four times a day (QID) | INTRAMUSCULAR | Status: DC

## 2019-12-29 MED ORDER — CEFAZOLIN SODIUM-DEXTROSE 2-4 GM/100ML-% IV SOLN: 2.0000 g | Freq: Once | INTRAVENOUS | Status: DC

## 2019-12-29 MED ORDER — PRENATAL MULTIVITAMIN CH: 1.0000 | ORAL_TABLET | Freq: Every day | ORAL | Status: DC

## 2019-12-29 MED ORDER — PROMETHAZINE HCL 25 MG/ML IJ SOLN: 6.2500 mg | INTRAMUSCULAR | Status: DC | PRN

## 2019-12-29 MED ORDER — KETOROLAC TROMETHAMINE 30 MG/ML IJ SOLN
30.0000 mg | Freq: Once | INTRAMUSCULAR | Status: AC
  Administered 2019-12-29: 30 mg via INTRAVENOUS

## 2019-12-29 MED ORDER — PROPOFOL 10 MG/ML IV BOLUS
INTRAVENOUS | Status: DC | PRN
  Administered 2019-12-29: 200 mg via INTRAVENOUS
  Administered 2019-12-29: 40 mg via INTRAVENOUS

## 2019-12-29 MED ORDER — LACTATED RINGERS IV SOLN: INTRAVENOUS | Status: DC | PRN

## 2019-12-29 MED ORDER — SUGAMMADEX SODIUM 200 MG/2ML IV SOLN
INTRAVENOUS | Status: DC | PRN
  Administered 2019-12-29: 700 mg via INTRAVENOUS

## 2019-12-29 MED ORDER — ONDANSETRON HCL 4 MG/2ML IJ SOLN
INTRAMUSCULAR | Status: AC
  Filled 2019-12-29: qty 2

## 2019-12-29 MED ORDER — ENOXAPARIN SODIUM 60 MG/0.6ML ~~LOC~~ SOLN
60.0000 mg | SUBCUTANEOUS | Status: DC
  Administered 2019-12-30 – 2020-01-01 (×3): 60 mg via SUBCUTANEOUS
  Filled 2019-12-29 (×3): qty 0.6

## 2019-12-29 MED ORDER — MISOPROSTOL 25 MCG QUARTER TABLET: 25.0000 ug | ORAL_TABLET | Freq: Three times a day (TID) | ORAL | Status: DC | PRN

## 2019-12-29 MED ORDER — CEFAZOLIN SODIUM-DEXTROSE 2-4 GM/100ML-% IV SOLN
INTRAVENOUS | Status: AC
  Filled 2019-12-29: qty 100

## 2019-12-29 MED ORDER — ACETAMINOPHEN 500 MG PO TABS
1000.0000 mg | ORAL_TABLET | Freq: Four times a day (QID) | ORAL | Status: DC
  Administered 2019-12-29 – 2020-01-01 (×10): 1000 mg via ORAL
  Filled 2019-12-29 (×11): qty 2

## 2019-12-29 MED ORDER — DIPHENHYDRAMINE HCL 25 MG PO CAPS: 25.0000 mg | ORAL_CAPSULE | Freq: Four times a day (QID) | ORAL | Status: DC | PRN

## 2019-12-29 MED ORDER — WITCH HAZEL-GLYCERIN EX PADS: 1.0000 "application " | MEDICATED_PAD | CUTANEOUS | Status: DC | PRN

## 2019-12-29 MED ORDER — SENNOSIDES-DOCUSATE SODIUM 8.6-50 MG PO TABS: 2.0000 | ORAL_TABLET | ORAL | Status: DC

## 2019-12-29 MED ORDER — OXYTOCIN 40 UNITS IN NORMAL SALINE INFUSION - SIMPLE MED: 2.5000 [IU]/h | INTRAVENOUS | Status: DC

## 2019-12-29 MED ORDER — SIMETHICONE 80 MG PO CHEW
80.0000 mg | CHEWABLE_TABLET | ORAL | Status: DC
  Administered 2019-12-29 – 2020-01-01 (×3): 80 mg via ORAL
  Filled 2019-12-29 (×3): qty 1

## 2019-12-29 MED ORDER — LORAZEPAM 2 MG/ML IJ SOLN
2.0000 mg | Freq: Once | INTRAMUSCULAR | Status: AC
  Administered 2019-12-29: 2 mg via INTRAVENOUS
  Filled 2019-12-29: qty 1

## 2019-12-29 MED ORDER — COCONUT OIL OIL: 1.0000 "application " | TOPICAL_OIL | Status: DC | PRN

## 2019-12-29 MED ORDER — SUGAMMADEX SODIUM 500 MG/5ML IV SOLN
INTRAVENOUS | Status: AC
  Filled 2019-12-29: qty 5

## 2019-12-29 MED ORDER — FENTANYL CITRATE (PF) 100 MCG/2ML IJ SOLN
INTRAMUSCULAR | Status: DC | PRN
  Administered 2019-12-29 (×3): 25 ug via INTRAVENOUS
  Administered 2019-12-29: 100 ug via INTRAVENOUS

## 2019-12-29 MED ORDER — DIBUCAINE (PERIANAL) 1 % EX OINT: 1.0000 "application " | TOPICAL_OINTMENT | CUTANEOUS | Status: DC | PRN

## 2019-12-29 MED ORDER — PROPOFOL 500 MG/50ML IV EMUL
INTRAVENOUS | Status: DC | PRN
  Administered 2019-12-29 (×2): 100 ug/kg/min via INTRAVENOUS

## 2019-12-29 MED ORDER — FENTANYL CITRATE (PF) 250 MCG/5ML IJ SOLN
INTRAMUSCULAR | Status: AC
  Filled 2019-12-29: qty 5

## 2019-12-29 MED ORDER — DEXAMETHASONE SODIUM PHOSPHATE 10 MG/ML IJ SOLN
INTRAMUSCULAR | Status: AC
  Filled 2019-12-29: qty 1

## 2019-12-29 MED ORDER — SIMETHICONE 80 MG PO CHEW: 80.0000 mg | CHEWABLE_TABLET | ORAL | Status: DC | PRN

## 2019-12-29 MED ORDER — MISOPROSTOL 50MCG HALF TABLET
50.0000 ug | ORAL_TABLET | Freq: Three times a day (TID) | ORAL | Status: DC | PRN
  Administered 2019-12-29: 50 ug via BUCCAL
  Filled 2019-12-29: qty 1

## 2019-12-29 MED ORDER — OXYTOCIN 40 UNITS IN NORMAL SALINE INFUSION - SIMPLE MED
1.0000 m[IU]/min | INTRAVENOUS | Status: DC
  Administered 2019-12-29: 2 m[IU]/min via INTRAVENOUS
  Filled 2019-12-29: qty 1000

## 2019-12-29 MED ORDER — PROMETHAZINE HCL 25 MG/ML IJ SOLN
25.0000 mg | Freq: Four times a day (QID) | INTRAMUSCULAR | Status: DC | PRN
  Administered 2019-12-29: 25 mg via INTRAVENOUS
  Filled 2019-12-29: qty 1

## 2019-12-29 MED ORDER — DEXAMETHASONE SODIUM PHOSPHATE 10 MG/ML IJ SOLN
INTRAMUSCULAR | Status: DC | PRN
  Administered 2019-12-29: 10 mg via INTRAVENOUS

## 2019-12-29 MED ORDER — CEFAZOLIN SODIUM-DEXTROSE 2-3 GM-%(50ML) IV SOLR
INTRAVENOUS | Status: DC | PRN
  Administered 2019-12-29: 2 g via INTRAVENOUS

## 2019-12-29 MED ORDER — HYDROMORPHONE HCL 1 MG/ML IJ SOLN
0.2000 mg | INTRAMUSCULAR | Status: DC | PRN
  Administered 2019-12-29: 0.5 mg via INTRAVENOUS
  Filled 2019-12-29: qty 1

## 2019-12-29 MED ORDER — ONDANSETRON HCL 4 MG/2ML IJ SOLN
INTRAMUSCULAR | Status: DC | PRN
  Administered 2019-12-29: 4 mg via INTRAVENOUS

## 2019-12-29 MED ORDER — ZOLPIDEM TARTRATE 5 MG PO TABS: 5.0000 mg | ORAL_TABLET | Freq: Every evening | ORAL | Status: DC | PRN

## 2019-12-29 MED ORDER — OXYTOCIN 40 UNITS IN NORMAL SALINE INFUSION - SIMPLE MED
2.5000 [IU]/h | INTRAVENOUS | Status: AC
  Administered 2019-12-29: 2.5 [IU]/h via INTRAVENOUS

## 2019-12-29 MED ORDER — LIDOCAINE 2% (20 MG/ML) 5 ML SYRINGE
INTRAMUSCULAR | Status: AC
  Filled 2019-12-29: qty 5

## 2019-12-29 MED ORDER — IBUPROFEN 800 MG PO TABS: 800.0000 mg | ORAL_TABLET | Freq: Four times a day (QID) | ORAL | Status: DC

## 2019-12-29 MED ORDER — PRENATAL MULTIVITAMIN CH
1.0000 | ORAL_TABLET | Freq: Every day | ORAL | Status: DC
  Administered 2019-12-30 – 2020-01-01 (×3): 1 via ORAL
  Filled 2019-12-29 (×3): qty 1

## 2019-12-29 MED ORDER — OXYTOCIN 40 UNITS IN NORMAL SALINE INFUSION - SIMPLE MED
INTRAVENOUS | Status: AC
  Filled 2019-12-29: qty 1000

## 2019-12-29 MED ORDER — SIMETHICONE 80 MG PO CHEW: 80.0000 mg | CHEWABLE_TABLET | ORAL | Status: DC

## 2019-12-29 MED ORDER — OXYCODONE HCL 5 MG PO TABS
5.0000 mg | ORAL_TABLET | ORAL | Status: DC | PRN
  Administered 2019-12-30 – 2019-12-31 (×5): 5 mg via ORAL
  Administered 2019-12-31: 10 mg via ORAL
  Administered 2020-01-01: 5 mg via ORAL
  Administered 2020-01-01: 10 mg via ORAL
  Filled 2019-12-29: qty 2
  Filled 2019-12-29 (×2): qty 1
  Filled 2019-12-29: qty 2
  Filled 2019-12-29 (×4): qty 1

## 2019-12-29 SURGICAL SUPPLY — 34 items
BENZOIN TINCTURE PRP APPL 2/3 (GAUZE/BANDAGES/DRESSINGS) ×3 IMPLANT
CHLORAPREP W/TINT 26ML (MISCELLANEOUS) ×3 IMPLANT
CLAMP CORD UMBIL (MISCELLANEOUS) IMPLANT
CLOSURE WOUND 1/2 X4 (GAUZE/BANDAGES/DRESSINGS) ×1
DRSG OPSITE POSTOP 4X10 (GAUZE/BANDAGES/DRESSINGS) ×3 IMPLANT
ELECT REM PT RETURN 9FT ADLT (ELECTROSURGICAL) ×3
ELECTRODE REM PT RTRN 9FT ADLT (ELECTROSURGICAL) ×1 IMPLANT
EXTRACTOR VACUUM M CUP 4 TUBE (SUCTIONS) IMPLANT
EXTRACTOR VACUUM M CUP 4' TUBE (SUCTIONS)
GLOVE BIOGEL PI IND STRL 6.5 (GLOVE) ×1 IMPLANT
GLOVE BIOGEL PI IND STRL 7.0 (GLOVE) ×1 IMPLANT
GLOVE BIOGEL PI INDICATOR 6.5 (GLOVE) ×2
GLOVE BIOGEL PI INDICATOR 7.0 (GLOVE) ×2
GLOVE SURG SS PI 6.0 STRL IVOR (GLOVE) ×3 IMPLANT
GOWN STRL REUS W/TWL LRG LVL3 (GOWN DISPOSABLE) ×6 IMPLANT
KIT ABG SYR 3ML LUER SLIP (SYRINGE) IMPLANT
NEEDLE HYPO 25X5/8 SAFETYGLIDE (NEEDLE) IMPLANT
NS IRRIG 1000ML POUR BTL (IV SOLUTION) ×3 IMPLANT
PACK C SECTION WH (CUSTOM PROCEDURE TRAY) ×3 IMPLANT
PAD ABD DERMACEA PRESS 5X9 (GAUZE/BANDAGES/DRESSINGS) ×3 IMPLANT
PAD OB MATERNITY 4.3X12.25 (PERSONAL CARE ITEMS) ×3 IMPLANT
PENCIL SMOKE EVAC W/HOLSTER (ELECTROSURGICAL) ×3 IMPLANT
RTRCTR C-SECT PINK 25CM LRG (MISCELLANEOUS) IMPLANT
SEPRAFILM MEMBRANE 5X6 (MISCELLANEOUS) IMPLANT
STRIP CLOSURE SKIN 1/2X4 (GAUZE/BANDAGES/DRESSINGS) ×2 IMPLANT
SUT PLAIN 0 NONE (SUTURE) IMPLANT
SUT PLAIN 2 0 (SUTURE) ×2
SUT PLAIN ABS 2-0 CT1 27XMFL (SUTURE) ×1 IMPLANT
SUT VIC AB 0 CT1 36 (SUTURE) ×12 IMPLANT
SUT VIC AB 2-0 CT1 (SUTURE) ×6 IMPLANT
SUT VIC AB 4-0 KS 27 (SUTURE) ×3 IMPLANT
TOWEL OR 17X24 6PK STRL BLUE (TOWEL DISPOSABLE) ×3 IMPLANT
TRAY FOLEY W/BAG SLVR 14FR LF (SET/KITS/TRAYS/PACK) ×3 IMPLANT
WATER STERILE IRR 1000ML POUR (IV SOLUTION) ×3 IMPLANT

## 2019-12-29 NOTE — H&P (Signed)
Catricia Scheerer is a 34 y.o. female G5P0040 at 34.4wks presenting for IOL due to severe SIPE on cHTN. She was having her routine antenatal testing on 2/12 when she was noted to have elevated BPs and was sent to MAU for a pre-e workup, where she had elevated urine P/C ratio with severe range BPs.  Chart review shows platelet decrease from 239>158 over 6wks. She has not required antihypertensives during pregnancy. Denies H/A, RUQ pain or visual disturbances. No ctx, leaking or bldg. Her preg has been followed by the [facility] service and has been remarkable for:  # cHTN (no meds) # recurrent preg loss (on Lovenox, last dose 2/12) # Rh neg # FGR at 3rd% (1528gm at 32wks) w/ increased S/D ratio & nl fluid  OB History    Gravida  5   Para      Term      Preterm      AB  4   Living  0     SAB  3   TAB      Ectopic  1   Multiple      Live Births           Obstetric Comments  One adopted daughter        Past Medical History:  Diagnosis Date  . Kidney stone   . Migraine    Past Surgical History:  Procedure Laterality Date  . KIDNEY STONE SURGERY  2014   L and R    Family History: family history includes Breast cancer in her maternal grandmother; Endometrial cancer in her mother and sister; Lung cancer in her maternal grandmother; Skin cancer in her maternal grandmother; Stroke in her maternal grandfather. Social History:  reports that she has never smoked. She has never used smokeless tobacco. She reports previous alcohol use. She reports that she does not use drugs.     Maternal Diabetes: No Genetic Screening: Normal Maternal Ultrasounds/Referrals: IUGR Fetal Ultrasounds or other Referrals:  Referred to Materal Fetal Medicine  Maternal Substance Abuse:  No Significant Maternal Medications:  Meds include: Other: Lovenox Significant Maternal Lab Results:  Rh negative and Other: GBS unknown Other Comments:  None  Review of Systems History   Blood pressure (!)  145/94, pulse 88, temperature 98 F (36.7 C), temperature source Oral, resp. rate 18, height 5\' 4"  (1.626 m), weight 111.8 kg, last menstrual period 05/01/2019. Exam Physical Exam  Constitutional: She is oriented to person, place, and time. She appears well-developed.  HENT:  Head: Normocephalic.  Cardiovascular: Normal rate.  Respiratory: Effort normal.  GI:  EFM 130s, +accels, no decels Ctx rare  Genitourinary:    Vagina normal.     Genitourinary Comments: Cx 1/long/firm/vtx -3   Musculoskeletal:        General: Normal range of motion.     Cervical back: Normal range of motion.  Neurological: She is alert and oriented to person, place, and time.  Skin: Skin is warm and dry.  Psychiatric: She has a normal mood and affect. Her behavior is normal. Thought content normal.    CBC    Component Value Date/Time   WBC 10.1 12/28/2019 1948   RBC 4.07 12/28/2019 1948   HGB 11.0 (L) 12/28/2019 1948   HGB 11.9 11/13/2019 0949   HCT 34.6 (L) 12/28/2019 1948   HCT 35.2 11/13/2019 0949   PLT 158 12/28/2019 1948   PLT 239 11/13/2019 0949   MCV 85.0 12/28/2019 1948   MCV 84 11/13/2019 0949   MCH  27.0 12/28/2019 1948   MCHC 31.8 12/28/2019 1948   RDW 14.6 12/28/2019 1948   RDW 14.0 11/13/2019 0949   LYMPHSABS 1.9 08/16/2019 1504   EOSABS 0.2 08/16/2019 1504   BASOSABS 0.0 08/16/2019 1504   CMP     Component Value Date/Time   NA 139 12/28/2019 1948   NA 136 08/16/2019 1504   K 3.9 12/28/2019 1948   CL 109 12/28/2019 1948   CO2 20 (L) 12/28/2019 1948   GLUCOSE 89 12/28/2019 1948   BUN 9 12/28/2019 1948   BUN 4 (L) 08/16/2019 1504   CREATININE 0.88 12/28/2019 1948   CALCIUM 8.9 12/28/2019 1948   PROT 6.0 (L) 12/28/2019 1948   PROT 6.8 08/16/2019 1504   ALBUMIN 2.5 (L) 12/28/2019 1948   ALBUMIN 4.1 08/16/2019 1504   AST 24 12/28/2019 1948   ALT 16 12/28/2019 1948   ALKPHOS 95 12/28/2019 1948   BILITOT 0.4 12/28/2019 1948   BILITOT 0.2 08/16/2019 1504   GFRNONAA >60  12/28/2019 1948   GFRAA >60 12/28/2019 1948   Urine P/C ratio: 0.41  Prenatal labs: ABO, Rh: --/--/A NEG (02/12 1957) Antibody: POS (02/12 1957) Rubella: 3.45 (10/01 1504) RPR: Non Reactive (12/29 0949)  HBsAg: Negative (10/01 1504)  HIV: Non Reactive (12/29 0949)  GBS: --/NEGATIVE (02/12 2207) -was a PCR; unable to be used  Assessment/Plan: IUP@34 .4wks Severe SIPE on cHTN (BPs, elevated P/C, lower platelets 239>158 in 6wks) FGR (3rd%) with increased S/D ratio Rh neg Unfavorable cx  Admit to Labor & Delivery Mag sulfate for eclampsia ppx Labetalol po 200mg  bid for BP control (IV as needed) BMZ series PCN for GBS unknown in preterm Cx ripening with cytotec/cervical foley (placed) followed by Pit/AROM prn  Anticipate vag del as long as FHR can tolerate labor             Plan Rhogam eval PP   Myrtis Ser CNM 12/29/2019, 2:25 AM

## 2019-12-29 NOTE — Anesthesia Procedure Notes (Signed)
Procedure Name: Intubation Date/Time: 12/29/2019 2:12 PM Performed by: Earmon Phoenix, CRNA Pre-anesthesia Checklist: Patient identified, Emergency Drugs available, Suction available, Patient being monitored and Timeout performed Patient Re-evaluated:Patient Re-evaluated prior to induction Oxygen Delivery Method: Circle system utilized Preoxygenation: Pre-oxygenation with 100% oxygen Induction Type: IV induction, Rapid sequence and Cricoid Pressure applied Laryngoscope Size: Glidescope (Lopro 3) Grade View: Grade I Tube type: Oral Tube size: 7.0 mm Number of attempts: 1 Airway Equipment and Method: Video-laryngoscopy and Stylet Placement Confirmation: ETT inserted through vocal cords under direct vision,  positive ETCO2,  CO2 detector and breath sounds checked- equal and bilateral Secured at: 19 cm Tube secured with: Tape Dental Injury: Teeth and Oropharynx as per pre-operative assessment  Comments: Per Dr Stephannie Peters

## 2019-12-29 NOTE — Transfer of Care (Signed)
Immediate Anesthesia Transfer of Care Note  Patient: Denise [name]  Procedure(s) Performed: CESAREAN SECTION (N/A )  Patient Location: PACU  Anesthesia Type:General  Level of Consciousness: awake, alert , oriented and patient cooperative  Airway & Oxygen Therapy: Patient Spontanous Breathing and Patient connected to face mask oxygen  Post-op Assessment: Report given to RN and Post -op Vital signs reviewed and stable  Post vital signs: Reviewed and stable  Last Vitals:  Vitals Value Taken Time  BP 132/89 12/29/19 1515  Temp    Pulse 90 12/29/19 1521  Resp 20 12/29/19 1521  SpO2 96 % 12/29/19 1521  Vitals shown include unvalidated device data.  Last Pain:  Vitals:   12/29/19 1506  TempSrc: Axillary  PainSc: 0-No pain      Patients Stated Pain Goal: 6 (12/29/19 0701)  Complications: No apparent anesthesia complications

## 2019-12-29 NOTE — Addendum Note (Signed)
Addendum  created 12/29/19 1757 by Earmon Phoenix, CRNA   Child order released for a procedure order, Clinical Note Signed, Flowsheet accepted, Intraprocedure Blocks edited, Intraprocedure Event edited, Intraprocedure Flowsheets edited, LDA created via procedure documentation

## 2019-12-29 NOTE — Progress Notes (Signed)
CNM called to bedside due to bleeding. On exam, moderate amount of bright red bleeding on pad. Pitocin stopped and cervical exam unchanged. Large clots removed from vagina. Dr. Jolayne Panther called to bedside due to bleeding and FHR late decelerations. Code Cesarean called by Dr. Jolayne Panther.   Rolm Bookbinder, CNM 12/29/19

## 2019-12-29 NOTE — Progress Notes (Signed)
Labor Progress Note Denise [name] is a 34 y.o. U9W1191 at [redacted]w[redacted]d presented for IOL for preeclampsia with severe features  S:  Patient able to rest after phenergan. Deneis any pain.  O:  BP 109/63 (BP Location: Right Arm)   Pulse 85   Temp 98.1 F (36.7 C) (Oral)   Resp 16   Ht 5\' 4"  (1.626 m)   Wt 111.8 kg   LMP 05/01/2019   BMI 42.29 kg/m   Fetal Tracing:  Baseline: 130 Variability: moderate Accels: 15x15 Decels: variable  Toco: 4-8   CVE: Dilation: 4.5 Effacement (%): 70 Cervical Position: Posterior Station: -2 Presentation: Vertex Exam by:: [email or web address] CNM    A&P: 34 y.o. G5P0040 [redacted]w[redacted]d IOL preeclampsia with severe features #Labor: FHR decelerations noted. Unable to tell type because unable to trace ctx. Discussed risks and benefits of IUPC placement for monitoring contractions more accurately. Patient agreeable to plan of care. AROM with large amount of clear fluid. IUPC placed without difficulty and patient tolerated procedure well. Will continue to observe and titrate pit accordingly.  #Pain: per patient request #FWB: Cat 1 #GBS preterm, PCN   [redacted]w[redacted]d, CNM 1:50 PM

## 2019-12-29 NOTE — Progress Notes (Signed)
Patient ID: Denise [name], female   DOB: [birth year], 34 y.o.   MRN: [id number]  Sitting up in chair; feels well but tired; had some twitching in her legs in L arm during the night- given Ativan 2mg  with improvement; cervical foley still in place and s/p cytotec x 2 doses; mag sulfate infusing  BP 132/82, P 98 FHR 125-130, +accels, no decls Ctx irreg Cx deferred  IUP@34 .4wks Severe SIPE on cHTN FGR Cx unfavorable  Plan to assess again at 0900 to determine next plan of care  12/29/2019

## 2019-12-29 NOTE — Op Note (Signed)
Yamila Cragin PROCEDURE DATE: 12/28/2019 - 12/29/2019  PREOPERATIVE DIAGNOSIS: Intrauterine pregnancy at  [redacted]w[redacted]d weeks gestation; non-reassuring fetal status, severe preeclampsia and IUGR and placental abruption  POSTOPERATIVE DIAGNOSIS: The same  PROCEDURE:     Cesarean Section  SURGEON:  Dr. Catalina Antigua  ASSISTANT: Dr. Salomon Mast  INDICATIONS: Gerilynn Mccullars is a 34 y.o. [id number] at [redacted]w[redacted]d scheduled for cesarean section secondary to non-reassuring fetal status, severe preeclampsia and IUGR and placental abruption.  The risks of cesarean section discussed with the patient included but were not limited to: bleeding which may require transfusion or reoperation; infection which may require antibiotics; injury to bowel, bladder, ureters or other surrounding organs; injury to the fetus; need for additional procedures including hysterectomy in the event of a life-threatening hemorrhage; placental abnormalities wth subsequent pregnancies, incisional problems, thromboembolic phenomenon and other postoperative/anesthesia complications. The patient concurred with the proposed plan, giving informed written consent for the procedure.    FINDINGS:  Viable female infant in cephalic presentation.  Apgars and weight not available at the time of the note.  Clear amniotic fluid.  Intact placenta with several small clots, three vessel cord.  Normal uterus, fallopian tubes and ovaries bilaterally.  ANESTHESIA:    Spinal INTRAVENOUS FLUIDS:2300 ml ESTIMATED BLOOD LOSS: 792 ml URINE OUTPUT:  500 ml SPECIMENS: Placenta sent to pathology COMPLICATIONS: None immediate  PROCEDURE IN DETAIL:  The patient received intravenous antibiotics and had sequential compression devices applied to her lower extremities while in the preoperative area.  She was then taken to the operating room where anesthesia was induced and was found to be adequate. A foley catheter was placed into her bladder and attached to Maurya Nethery gravity. She was  then placed in a dorsal supine position with a leftward tilt, and prepped and draped in a sterile manner. After an adequate timeout was performed, a Pfannenstiel skin incision was made with scalpel and carried through to the underlying layer of fascia. The fascia was incised in the midline and this incision was extended bilaterally using the Mayo scissors. Kocher clamps were applied to the superior aspect of the fascial incision and the underlying rectus muscles were dissected off bluntly. A similar process was carried out on the inferior aspect of the facial incision. The rectus muscles were separated in the midline bluntly and the peritoneum was entered bluntly. The Alexis self-retaining retractor was introduced into the abdominal cavity. Attention was turned to the lower uterine segment where a transverse hysterotomy was made with a scalpel and extended bilaterally bluntly. The infant was successfully delivered, and cord was clamped and cut and infant was handed over to awaiting neonatology team. Uterine massage was then administered and the placenta delivered intact with three-vessel cord. The uterus was cleared of clot and debris.  The hysterotomy was closed with 0 Vicryl in a running locked fashion, and an imbricating layer was also placed with a 0 Vicryl. Overall, excellent hemostasis was noted. The pelvis copiously irrigated and cleared of all clot and debris. Hemostasis was confirmed on all surfaces.  The peritoneum and the muscles were reapproximated using 0 vicryl interrupted stitches. The fascia was then closed using 0 Vicryl in a running fashion.  The subcutaneous layer was reapproximated with plain gut and the skin was closed in a subcuticular fashion using 3.0 Vicryl. The patient tolerated the procedure well. Sponge, lap, instrument and needle counts were correct x 2. She was taken to the recovery room in stable condition.    Lyndon Chenoweth ConstantMD  12/29/2019 2:39 PM

## 2019-12-29 NOTE — Anesthesia Procedure Notes (Addendum)
Procedure Name: Intubation Date/Time: 12/29/2019 2:12 PM Performed by: Kaylyn Layer, MD Pre-anesthesia Checklist: Patient identified, Emergency Drugs available, Suction available and Patient being monitored Patient Re-evaluated:Patient Re-evaluated prior to induction Oxygen Delivery Method: Circle system utilized Preoxygenation: Pre-oxygenation with 100% oxygen Induction Type: IV induction and Rapid sequence Laryngoscope Size: Glidescope and 3 Tube type: Oral Tube size: 7.0 mm Number of attempts: 1 Airway Equipment and Method: Rigid stylet,  Oral airway and Video-laryngoscopy Placement Confirmation: ETT inserted through vocal cords under direct vision,  positive ETCO2 and CO2 detector Secured at: 20 cm Tube secured with: Tape Dental Injury: Teeth and Oropharynx as per pre-operative assessment

## 2019-12-29 NOTE — Anesthesia Postprocedure Evaluation (Signed)
Anesthesia Post Note  Patient: Denise [name]  Procedure(s) Performed: CESAREAN SECTION (N/A )     Patient location during evaluation: PACU Anesthesia Type: General Level of consciousness: awake and alert and oriented Pain management: pain level controlled Vital Signs Assessment: post-procedure vital signs reviewed and stable Respiratory status: spontaneous breathing, nonlabored ventilation and respiratory function stable Cardiovascular status: blood pressure returned to baseline Postop Assessment: no apparent nausea or vomiting Anesthetic complications: no    Last Vitals:  Vitals:   12/29/19 1657 12/29/19 1701  BP:  (!) 137/92  Pulse: 95 89  Resp: (!) 21 19  Temp:    SpO2: 95% 94%    Last Pain:  Vitals:   12/29/19 1645  TempSrc:   PainSc: 1    Pain Goal: Patients Stated Pain Goal: 2 (12/29/19 1530)                 Kaylyn Layer

## 2019-12-29 NOTE — Anesthesia Preprocedure Evaluation (Addendum)
Anesthesia Evaluation  Patient identified by MRN, date of birth, ID band Patient awake    Reviewed: Allergy & Precautions, NPO status , Patient's Chart, lab work & pertinent test results  History of Anesthesia Complications (+) MALIGNANT HYPERTHERMIA, Family history of anesthesia reaction and history of anesthetic complications (Grandfather died from malignant hyperthermia)  Airway Mallampati: II  TM Distance: >3 FB Neck ROM: Full    Dental no notable dental hx.    Pulmonary neg pulmonary ROS,    Pulmonary exam normal        Cardiovascular hypertension, Normal cardiovascular exam     Neuro/Psych negative neurological ROS  negative psych ROS   GI/Hepatic negative GI ROS, Neg liver ROS,   Endo/Other  negative endocrine ROS  Renal/GU negative Renal ROS  negative genitourinary   Musculoskeletal negative musculoskeletal ROS (+)   Abdominal   Peds  Hematology negative hematology ROS (+)   Anesthesia Other Findings Day of surgery medications reviewed with patient.  Reproductive/Obstetrics (+) Pregnancy                             Anesthesia Physical Anesthesia Plan  ASA: III and emergent  Anesthesia Plan: General   Post-op Pain Management:    Induction: Intravenous, Rapid sequence and Cricoid pressure planned  PONV Risk Score and Plan: 4 or greater and Treatment may vary due to age or medical condition, Ondansetron, Dexamethasone, Propofol infusion and TIVA  Airway Management Planned: Oral ETT and Video Laryngoscope Planned  Additional Equipment: None  Intra-op Plan:   Post-operative Plan: Extubation in OR  Informed Consent: I have reviewed the patients History and Physical, chart, labs and discussed the procedure including the risks, benefits and alternatives for the proposed anesthesia with the patient or authorized representative who has indicated his/her understanding and  acceptance.       Plan Discussed with: CRNA  Anesthesia Plan Comments: (Stat C/S called for suspected abruption.)      Anesthesia Quick Evaluation

## 2019-12-29 NOTE — Progress Notes (Signed)
Labor Progress Note Denise [name] is a 34 y.o. [id number] at [redacted]w[redacted]d presented for IOL for preeclampsia with severe features.  S:  Patient comfortable, tired and unable to rest  O:  BP 129/84 (BP Location: Right Arm)   Pulse 96   Temp 98.4 F (36.9 C) (Oral)   Resp 16   Ht 5\' 4"  (1.626 m)   Wt 111.8 kg   LMP 05/01/2019   BMI 42.29 kg/m   Fetal Tracing:  Baseline: 120 Variability: moderate Accels: 15x15 Decels: none  Toco: none   CVE: 4.5/70/-2  A&P: 34 y.o. G5P0040 [redacted]w[redacted]d IOL preeclampsia with severe features. #Labor: Progressing well. FB out. Discussed with patient starting pitocin for labor induction. Patient agreeable to plan of care. Will eat breakfast then start pit 2x2. Will try IV phenergan for sleep #Pain: per patient request  #FWB: Cat 1 #GBS unknown, PCN  [redacted]w[redacted]d, CNM 9:52 AM

## 2019-12-29 NOTE — Discharge Summary (Signed)
Postpartum Discharge Summary     Patient Name: Denise [name] DOB: 09-27-1986 MRN: [id number]  Date of admission: 12/28/2019 Delivering Provider: Mora Bellman   Date of discharge: 01/01/2020  Admitting diagnosis: Pregnancy at 34/4. IOL for severe pre-eclampsia superimposed on cHTN (BPs)   Additional problems: Rh negative     Discharge diagnosis: Preterm Pregnancy Delivered                                                                                                Post partum procedures:rhogam  Augmentation: AROM, Pitocin, Cytotec and Foley Balloon  Complications: Placental Abruption  Hospital course:  Induction of Labor With Cesarean Section  34 y.o. yo G5P0040 at 67w4dwas admitted to the hospital 12/28/2019 for induction of labor. Patient had a labor course significant for induction of labor with FB and cytotec. She was started on Pitocin, then after AROM was noted to have large amounts of blood clots with a drop in fetal heart rate. The patient went for cesarean section due to Non-Reassuring FHR and suspected abruption remote from delivery, and delivered a Viable infant,12/29/2019  Membrane Rupture Time/Date: 1:44 PM ,12/29/2019   Details of operation can be found in separate operative Note.  Patient had an uncomplicated postpartum course. She is ambulating, tolerating a regular diet, passing flatus, and urinating well.  Patient is discharged home in stable condition on 01/01/20.        Magnesium Sulfate received: Yes BMZ received: Yes Rhophylac:Yes  Physical exam  Vitals:   12/31/19 2127 01/01/20 0005 01/01/20 0611 01/01/20 0740  BP: (!) 151/93 139/84 (!) 153/91 135/81  Pulse: 84 88 80 79  Resp: _0 Temp: 98.4 F (36.9 C) 98.6 F (37 C) 98 F (36.7 C) 98.6 F (37 C)  TempSrc: Oral Oral Oral Oral  SpO2: 98% 96% 98% 97%  Weight:      Height:       General: alert Lochia: appropriate Uterine Fundus: nttp, firm Incision: Dressing is clean, dry, and  intact DVT Evaluation: No evidence of DVT seen on physical exam. Labs: Lab Results  Component Value Date   WBC 13.2 (H) 12/30/2019   HGB 7.6 (L) 12/30/2019   HCT 24.4 (L) 12/30/2019   MCV 87.1 12/30/2019   PLT 168 12/30/2019   CMP Latest Ref Rng & Units 12/30/2019  Glucose 70 - 99 mg/dL -  BUN 6 - 20 mg/dL -  Creatinine 0.44 - 1.00 mg/dL 0.82  Sodium 135 - 145 mmol/L -  Potassium 3.5 - 5.1 mmol/L -  Chloride 98 - 111 mmol/L -  CO2 22 - 32 mmol/L -  Calcium 8.9 - 10.3 mg/dL -  Total Protein 6.5 - 8.1 g/dL -  Total Bilirubin 0.3 - 1.2 mg/dL -  Alkaline Phos 38 - 126 U/L -  AST 15 - 41 U/L -  ALT 0 - 44 U/L -   Edinburgh Score: Edinburgh Postnatal Depression Scale Screening Tool 12/30/2019  I have been able to laugh and see the funny side of things. 0  I have looked forward with enjoyment to things. 0  I have blamed myself unnecessarily when things went wrong. 1  I have been anxious or worried for no good reason. 1  I have felt scared or panicky for no good reason. 0  Things have been getting on top of me. 1  I have been so unhappy that I have had difficulty sleeping. 0  I have felt sad or miserable. 1  I have been so unhappy that I have been crying. 1  The thought of harming myself has occurred to me. 0  Edinburgh Postnatal Depression Scale Total 5    Discharge instruction: per After Visit Summary and "Baby and Me Booklet".  After visit meds:  Allergies as of 01/01/2020   No Known Allergies     Medication List    STOP taking these medications   aspirin EC 81 MG tablet   enoxaparin 40 MG/0.4ML injection Commonly known as: LOVENOX     TAKE these medications   Blood Pressure Kit Devi 1 Device by Does not apply route as needed.   doxylamine (Sleep) 25 MG tablet Commonly known as: UNISOM Take 25 mg by mouth at bedtime as needed for sleep.   ferrous gluconate 324 MG tablet Commonly known as: FERGON Take 1 tablet (324 mg total) by mouth daily with breakfast.    ibuprofen 600 MG tablet Commonly known as: ADVIL Take 1 tablet (600 mg total) by mouth every 6 (six) hours as needed.   NIFEdipine 30 MG 24 hr tablet Commonly known as: ADALAT CC Take 1 tablet (30 mg total) by mouth daily. Start taking on: January 02, 2020   oxyCODONE-acetaminophen 5-325 MG tablet Commonly known as: PERCOCET/ROXICET Take 1-2 tablets by mouth every 6 (six) hours as needed.   polyethylene glycol 17 g packet Commonly known as: MIRALAX / GLYCOLAX Take 17 g by mouth daily for 14 days.   prenatal multivitamin Tabs tablet Take 1 tablet by mouth daily at 12 noon.   simethicone 80 MG chewable tablet Commonly known as: MYLICON Chew 1 tablet (80 mg total) by mouth as needed for flatulence.       Diet: routine diet  Activity: Advance as tolerated. Pelvic rest for 6 weeks.   Outpatient follow up:4 weeks Follow up Appt: Future Appointments  Date Time Provider [place]  01/11/2020 10:00 AM [place] None  01/28/2020  1:00 PM Constant, Peggy, MD [place] None   Follow up Visit: [place]. Go in 10 day(s).   Specialty: Obstetrics and Gynecology Contact information: [address] [place] [phone]         Please schedule this patient for Postpartum visit in: 4 weeks with the following provider: Any provider For C/S patients schedule nurse incision check in weeks 2 weeks: yes High risk pregnancy complicated by: cHTN, Hx recurrent pregnancy loss, FGR Delivery mode:  CS Anticipated Birth Control:  Condoms PP Procedures needed: Incision check  Schedule Integrated BH visit: no  Newborn Data: Live born female  Birth Weight: 3 lb 14.4 oz (1770 g) APGAR: 5, 8  Newborn Delivery   Birth date/time: 12/29/2019 14:15:00 Delivery type: C-Section, Low Transverse Trial of labor: Yes C-section categorization: Primary      Baby Feeding:  Breast Disposition:NICU   01/01/2020 Aletha Halim, MD

## 2019-12-30 LAB — CBC
HCT: 24.4 % — ABNORMAL LOW (ref 36.0–46.0)
Hemoglobin: 7.6 g/dL — ABNORMAL LOW (ref 12.0–15.0)
MCH: 27.1 pg (ref 26.0–34.0)
MCHC: 31.1 g/dL (ref 30.0–36.0)
MCV: 87.1 fL (ref 80.0–100.0)
Platelets: 168 10*3/uL (ref 150–400)
RBC: 2.8 MIL/uL — ABNORMAL LOW (ref 3.87–5.11)
RDW: 15.1 % (ref 11.5–15.5)
WBC: 13.2 10*3/uL — ABNORMAL HIGH (ref 4.0–10.5)
nRBC: 0 % (ref 0.0–0.2)

## 2019-12-30 LAB — CREATININE, SERUM
Creatinine, Ser: 0.82 mg/dL (ref 0.44–1.00)
GFR calc Af Amer: >60 mL/min (ref >60)
GFR calc non Af Amer: >60 mL/min (ref >60)

## 2019-12-30 MED ORDER — RHO D IMMUNE GLOBULIN 1500 UNIT/2ML IJ SOSY
300.0000 ug | PREFILLED_SYRINGE | Freq: Once | INTRAMUSCULAR | Status: AC
  Administered 2019-12-30: 300 ug via INTRAVENOUS
  Filled 2019-12-30: qty 2

## 2019-12-30 MED ORDER — SODIUM CHLORIDE 0.9 % IV SOLN
510.0000 mg | Freq: Once | INTRAVENOUS | Status: AC
  Administered 2019-12-30: 510 mg via INTRAVENOUS
  Filled 2019-12-30: qty 17

## 2019-12-30 MED ORDER — CYCLOBENZAPRINE HCL 10 MG PO TABS
10.0000 mg | ORAL_TABLET | Freq: Three times a day (TID) | ORAL | Status: DC | PRN
  Administered 2019-12-30: 10 mg via ORAL
  Filled 2019-12-30: qty 1

## 2019-12-30 MED ORDER — NIFEDIPINE ER OSMOTIC RELEASE 30 MG PO TB24
30.0000 mg | ORAL_TABLET | Freq: Every day | ORAL | Status: DC
  Administered 2019-12-30 – 2020-01-01 (×3): 30 mg via ORAL
  Filled 2019-12-30 (×3): qty 1

## 2019-12-30 NOTE — Progress Notes (Signed)
Subjective: Postpartum Day 1: Cesarean Delivery Patient reports feeling well. She denies HA, visual changes, RUQ/epigastrinc pain. Patient ambulated in the room and denies feeling dizzy or lightheaded. She tolerated a regular diet    Objective: Vital signs in last 24 hours: Temp:  [97.1 F (36.2 C)-98.5 F (36.9 C)] 97.9 F (36.6 C) (02/14 0410) Pulse Rate:  [79-115] 86 (02/14 0410) Resp:  [12-24] 16 (02/14 0410) BP: (109-153)/(63-97) 113/70 (02/14 0410) SpO2:  [88 %-100 %] 98 % (02/14 0700)  Physical Exam:  General: alert, cooperative and no distress Lochia: appropriate Uterine Fundus: firm Incision: dressing clean, dry and intact DVT Evaluation: No evidence of DVT seen on physical exam.  Recent Labs    12/29/19 0924 12/30/19 0524  HGB 10.9* 7.6*  HCT 33.4* 24.4*    Assessment/Plan: Status post Cesarean section. Doing well postoperatively.  Continue magnesium sulfate for seizure prophylaxis until 24 hours postpartum. Continue monitoring BP Patient with post op asymptomatic anemia. Patient agreed to receiving feraheme  Juelle Dickmann 12/30/2019, 7:45 AM

## 2019-12-30 NOTE — Lactation Note (Addendum)
This note was copied from a baby's chart. Lactation Consultation Note  Patient Name: Denise [name] [id number] Date: 12/30/2019 Reason for consult: Initial assessment;Primapara;1st time breastfeeding;NICU baby;Late-preterm 34-36.6wks    "Denise [name]" [redacted]w[redacted]d, 3 lbs 14.4 oz.  LC in to visit with P1 Mom of LPTI weighing <4 lbs and in the NICU.  Baby 22 hrs old.  Mom was assisted to start double pumping last night.  Reviewed importance of regular double pumping on initiation setting, >8 times per 24 hrs.    Mom currently on MgSO4 for GHTN  Reviewed breast massage and hand expression, drops of colostrum noted.  Mom encouraged to massage and express prior to pumping and after.  Colostrum containers provided.  Benefits of having baby STS reviewed.  Assisted Mom with pumping, placing pillow support under both elbows.  24 mm flanges appear to be a good fit currently.  Reviewed importance of disassembling pump parts, washing, rinsing and air drying in separate bin provided.  Mom will be calling her insurance company tomorrow about a pump, plans to get a Spectra 2.  Mom informed of DEBP rental available in gift shop at hospital M-F.    Lactation brochure given to Mom and NICU booklet left for Mom to read.  Mom aware of LC support available to her and baby throughout her breastfeeding journey.     Interventions Interventions: Breast feeding basics reviewed;Skin to skin;Breast massage;Hand express;DEBP;Coconut oil  Lactation Tools Discussed/Used Tools: Pump;Coconut oil Breast pump type: Double-Electric Breast Pump WIC Program: No Pump Review: Setup, frequency, and cleaning;Milk Storage Initiated by:: Denise Pian RN IBCLC Date initiated:: 12/29/19   Consult Status Consult Status: Follow-up Date: 12/31/19 Follow-up type: In-patient    Denise [name] 12/30/2019, 12:44 PM

## 2019-12-30 NOTE — Plan of Care (Signed)
Patient has tolerated getting up in a wheelchair and going to NICU. Breast care and pumping discussed and first pump initiated.

## 2019-12-31 ENCOUNTER — Encounter (HOSPITAL_COMMUNITY): Payer: Self-pay | Admitting: Student

## 2019-12-31 DIAGNOSIS — Z98891 History of uterine scar from previous surgery: Secondary | ICD-10-CM

## 2019-12-31 DIAGNOSIS — D62 Acute posthemorrhagic anemia: Secondary | ICD-10-CM | POA: Diagnosis present

## 2019-12-31 DIAGNOSIS — O1414 Severe pre-eclampsia complicating childbirth: Secondary | ICD-10-CM | POA: Diagnosis present

## 2019-12-31 LAB — RH IG WORKUP (INCLUDES ABO/RH)
ABO/RH(D): A NEG
Fetal Screen: NEGATIVE
Gestational Age(Wks): 34.5
Unit division: 0

## 2019-12-31 LAB — GC/CHLAMYDIA PROBE AMP (~~LOC~~) NOT AT ARMC
Chlamydia: NEGATIVE
Comment: NEGATIVE
Comment: NORMAL
Neisseria Gonorrhea: NEGATIVE

## 2019-12-31 NOTE — Lactation Note (Signed)
This note was copied from a baby's chart. Lactation Consultation Note  Patient Name: Denise [name] [id number] Date: 12/31/2019  Attempted to see mom but she is sleeping.   Maternal Data    Feeding Feeding Type: Donor Breast Milk  LATCH Score                   Interventions    Lactation Tools Discussed/Used     Consult Status      Huston Foley 12/31/2019, 2:43 PM

## 2019-12-31 NOTE — Progress Notes (Signed)
Patient screened out for psychosocial assessment since none of the following apply:  Psychosocial stressors documented in mother or baby's chart  Gestation less than 32 weeks  Code at delivery   Infant with anomalies Please contact the Clinical Social Worker if specific needs arise, by MOB's request, or if MOB scores greater than 9/yes to question 10 on Edinburgh Postpartum Depression Screen.  Erik Burkett, LCSW Clinical Social Worker [facility] Cell#: [phone]     

## 2019-12-31 NOTE — Progress Notes (Signed)
Postpartum Day 2: Cesarean Delivery at [redacted]w[redacted]d for CHTN with Superimposed Severe Preeclampsia  Subjective: Patient reports feeling well. She denies HA, visual changes, RUQ/epigastrinc pain. Patient ambulated in the room and denies feeling dizzy or lightheaded. She tolerated a regular diet. Had flatus and a bowel movement. Baby doing well in NICU.  Objective: Patient Vitals for the past 24 hrs:  BP Temp Temp src Pulse Resp SpO2  12/31/19 0409 115/68 98.7 F (37.1 C) Oral 94 15 96 %  12/30/19 2338 137/82 98.7 F (37.1 C) Oral 81 18 96 %  12/30/19 1933 (!) 143/83 98.7 F (37.1 C) Oral 73 17 97 %  12/30/19 1555 130/83 98.6 F (37 C) Oral 88 17 100 %  12/30/19 1132 (!) 129/92 98.4 F (36.9 C) Oral 82 18 99 %  12/30/19 0930 - - - - 17 -  12/30/19 0805 117/72 97.7 F (36.5 C) Oral 75 18 100 %  12/30/19 0800 - - - - - 99 %  12/30/19 0755 - - - - - 99 %  12/30/19 0750 - - - - - 99 %  12/30/19 0745 - - - - - 98 %  12/30/19 0740 - - - - - 97 %  12/30/19 0735 - - - - - 97 %  12/30/19 0730 - - - - - 97 %  12/30/19 0725 - - - - - 97 %  12/30/19 0720 - - - - - 98 %  12/30/19 0715 - - - - - 98 %  12/30/19 0710 - - - - - 98 %  12/30/19 0705 - - - - - 98 %  12/30/19 0700 - - - - - 98 %  12/30/19 0655 - - - - - 98 %   Physical Exam:  General: alert, cooperative and no distress Lochia: appropriate Uterine Fundus: firm Incision: dressing clean, dry and intact DVT Evaluation: No evidence of DVT seen on physical exam.  Labs: CBC Latest Ref Rng & Units 12/30/2019 12/29/2019 12/28/2019  WBC 4.0 - 10.5 K/uL 13.2(H) 14.2(H) 10.1  Hemoglobin 12.0 - 15.0 g/dL 7.6(L) 10.9(L) 11.0(L)  Hematocrit 36.0 - 46.0 % 24.4(L) 33.4(L) 34.6(L)  Platelets 150 - 400 K/uL 168 179 158   CMP Latest Ref Rng & Units 12/30/2019 12/29/2019 12/28/2019  Glucose 70 - 99 mg/dL - 546(F) 89  BUN 6 - 20 mg/dL - 8 9  Creatinine 6.81 - 1.00 mg/dL 2.75 1.70 0.17  Sodium 135 - 145 mmol/L - 135 139  Potassium 3.5 - 5.1 mmol/L - 5.0  3.9  Chloride 98 - 111 mmol/L - 107 109  CO2 22 - 32 mmol/L - 17(L) 20(L)  Calcium 8.9 - 10.3 mg/dL - 8.1(L) 8.9  Total Protein 6.5 - 8.1 g/dL - 6.1(L) 6.0(L)  Total Bilirubin 0.3 - 1.2 mg/dL - 0.5 0.4  Alkaline Phos 38 - 126 U/L - 99 95  AST 15 - 41 U/L - 21 24  ALT 0 - 44 U/L - 16 16   Assessment/Plan: Status post Cesarean section. Doing well postoperatively.  Nifedipine XL 30 mg daily started for BP control, continue monitoring BP Patient with post op asymptomatic anemia, received Feraheme yesterday. Analgesia as needed Routine postpartum care, likely discharge tomorrow if BP stable and no other concerns.  Jaynie Collins, MD 12/31/2019, 6:53 AM

## 2019-12-31 NOTE — Plan of Care (Signed)
Home care teaching done and patient was able to teach it back to me.

## 2020-01-01 ENCOUNTER — Encounter (HOSPITAL_COMMUNITY): Payer: Self-pay | Admitting: Student

## 2020-01-01 LAB — TYPE AND SCREEN
ABO/RH(D): A NEG
Antibody Screen: POSITIVE
Unit division: 0
Unit division: 0

## 2020-01-01 LAB — BPAM RBC
Blood Product Expiration Date: 202103172359
Blood Product Expiration Date: 202103172359
Unit Type and Rh: 600
Unit Type and Rh: 600

## 2020-01-01 LAB — SURGICAL PATHOLOGY

## 2020-01-01 MED ORDER — NIFEDIPINE ER 30 MG PO TB24: 30.0000 mg | ORAL_TABLET | Freq: Every day | ORAL | 0 refills | Status: DC

## 2020-01-01 MED ORDER — POLYETHYLENE GLYCOL 3350 17 G PO PACK: 17.0000 g | PACK | Freq: Every day | ORAL | 0 refills | Status: AC

## 2020-01-01 MED ORDER — SIMETHICONE 80 MG PO CHEW: 80.0000 mg | CHEWABLE_TABLET | ORAL | 0 refills | Status: DC | PRN

## 2020-01-01 MED ORDER — OXYCODONE-ACETAMINOPHEN 5-325 MG PO TABS: 1.0000 | ORAL_TABLET | Freq: Four times a day (QID) | ORAL | 0 refills | Status: DC | PRN

## 2020-01-01 MED ORDER — FERROUS GLUCONATE 324 (38 FE) MG PO TABS: 324.0000 mg | ORAL_TABLET | Freq: Every day | ORAL | 0 refills | Status: DC

## 2020-01-01 MED ORDER — IBUPROFEN 600 MG PO TABS: 600.0000 mg | ORAL_TABLET | Freq: Four times a day (QID) | ORAL | 0 refills | Status: DC | PRN

## 2020-01-01 NOTE — Discharge Instructions (Signed)
Hypertension During Pregnancy Hypertension is also called high blood pressure. High blood pressure means that the force of your blood moving in your body is too strong. It can cause problems for you and your baby. Different types of high blood pressure can happen during pregnancy. The types are:  High blood pressure before you got pregnant. This is called chronic hypertension.  This can continue during your pregnancy. Your doctor will want to keep checking your blood pressure. You may need medicine to keep your blood pressure under control while you are pregnant. You will need follow-up visits after you have your baby.  High blood pressure that goes up during pregnancy when it was normal before. This is called gestational hypertension. It will usually get better after you have your baby, but your doctor will need to watch your blood pressure to make sure that it is getting better.  Very high blood pressure during pregnancy. This is called preeclampsia. Very high blood pressure is an emergency that needs to be checked and treated right away.  You may develop very high blood pressure after giving birth. This is called postpartum preeclampsia. This usually occurs within 48 hours after childbirth but may occur up to 6 weeks after giving birth. This is rare. How does this affect me? If you have high blood pressure during pregnancy, you have a higher chance of developing high blood pressure:  As you get older.  If you get pregnant again. In some cases, high blood pressure during pregnancy can cause:  Stroke.  Heart attack.  Damage to the kidneys, lungs, or liver.  Preeclampsia.  Jerky movements you cannot control (convulsions or seizures).  Problems with the placenta.   What can I do to lower my risk?   Keep a healthy weight.  Eat a healthy diet.  Follow what your doctor tells you about treating any medical problems that you had before becoming pregnant. It is very important to go to  all of your doctor visits. Your doctor will check your blood pressure and make sure that your pregnancy is progressing as it should. Treatment should start early if a problem is found.   Follow these instructions at home:  Take your blood pressure 1-2 times per day. Call the office if your blood pressure is 155 or higher for the top number or 105 or higher for the bottom number.    Eating and drinking   Drink enough fluid to keep your pee (urine) pale yellow.  Avoid caffeine. Lifestyle  Do not use any products that contain nicotine or tobacco, such as cigarettes, e-cigarettes, and chewing tobacco. If you need help quitting, ask your doctor.  Do not use alcohol or drugs.  Avoid stress.  Rest and get plenty of sleep.  Regular exercise can help. Ask your doctor what kinds of exercise are best for you. General instructions  Take over-the-counter and prescription medicines only as told by your doctor.  Keep all prenatal and follow-up visits as told by your doctor. This is important. Contact a doctor if:  You have symptoms that your doctor told you to watch for, such as: ? Headaches. ? Nausea. ? Vomiting. ? Belly (abdominal) pain. ? Dizziness. ? Light-headedness. Get help right away if:  You have: ? Very bad belly pain that does not get better with treatment. ? A very bad headache that does not get better. ? Vomiting that does not get better. ? Sudden, fast weight gain. ? Sudden swelling in your hands, ankles, or face. ?   Blood in your pee. ? Blurry vision. ? Double vision. ? Shortness of breath. ? Chest pain. ? Weakness on one side of your body. ? Trouble talking. Summary  High blood pressure is also called hypertension.  High blood pressure means that the force of your blood moving in your body is too strong.  High blood pressure can cause problems for you and your baby.  Keep all follow-up visits as told by your doctor. This is important. This information is  not intended to replace advice given to you by your health care provider. Make sure you discuss any questions you have with your health care provider. Document Released: 12/04/2010 Document Revised: 02/22/2019 Document Reviewed: 11/28/2018 Elsevier Patient Education  2020 [employer]   Postpartum Care After Cesarean Delivery This sheet gives you information about how to care for yourself from the time you deliver your baby to up to 6-12 weeks after delivery (postpartum period). Your health care provider may also give you more specific instructions. If you have problems or questions, contact your health care provider. Follow these instructions at home: Medicines  Take over-the-counter and prescription medicines only as told by your health care provider.  If you were prescribed an antibiotic medicine, take it as told by your health care provider. Do not stop taking the antibiotic even if you start to feel better.  Ask your health care provider if the medicine prescribed to you: ? Requires you to avoid driving or using heavy machinery. ? Can cause constipation. You may need to take actions to prevent or treat constipation, such as:  Drink enough fluid to keep your urine pale yellow.  Take over-the-counter or prescription medicines.  Eat foods that are high in fiber, such as beans, whole grains, and fresh fruits and vegetables.  Limit foods that are high in fat and processed sugars, such as fried or sweet foods. Activity  Gradually return to your normal activities as told by your health care provider.  Avoid activities that take a lot of effort and energy (are strenuous) until approved by your health care provider. Walking at a slow to moderate pace is usually safe. Ask your health care provider what activities are safe for you. ? Do not lift anything that is heavier than your baby or 10 lb (4.5 kg) as told by your health care provider. ? Do not vacuum, climb stairs, or drive a car for  as long as told by your health care provider.  If possible, have someone help you at home until you are able to do your usual activities yourself.  Rest as much as possible. Try to rest or take naps while your baby is sleeping. Vaginal bleeding  It is normal to have vaginal bleeding (lochia) after delivery. Wear a sanitary pad to absorb vaginal bleeding and discharge. ? During the first week after delivery, the amount and appearance of lochia is often similar to a menstrual period. ? Over the next few weeks, it will gradually decrease to a dry, yellow-brown discharge. ? For most women, lochia stops completely by 4-6 weeks after delivery. Vaginal bleeding can vary from woman to woman.  Change your sanitary pads frequently. Watch for any changes in your flow, such as: ? A sudden increase in volume. ? A change in color. ? Large blood clots.  If you pass a blood clot, save it and call your health care provider to discuss. Do not flush blood clots down the toilet before you get instructions from your health care provider.  Do not use tampons or douches until your health care provider says this is safe.  If you are not breastfeeding, your period should return 6-8 weeks after delivery. If you are breastfeeding, your period may return anytime between 8 weeks after delivery and the time that you stop breastfeeding. Perineal care   If your C-section (Cesarean section) was unplanned, and you were allowed to labor and push before delivery, you may have pain, swelling, and discomfort of the tissue between your vaginal opening and your anus (perineum). You may also have an incision in the tissue (episiotomy) or the tissue may have torn during delivery. Follow these instructions as told by your health care provider: ? Keep your perineum clean and dry as told by your health care provider. Use medicated pads and pain-relieving sprays and creams as directed. ? If you have an episiotomy or vaginal tear,  check the area every day for signs of infection. Check for:  Redness, swelling, or pain.  Fluid or blood.  Warmth.  Pus or a bad smell. ? You may be given a squirt bottle to use instead of wiping to clean the perineum area after you go to the bathroom. As you start healing, you may use the squirt bottle before wiping yourself. Make sure to wipe gently. ? To relieve pain caused by an episiotomy, vaginal tear, or hemorrhoids, try taking a warm sitz bath 2-3 times a day. A sitz bath is a warm water bath that is taken while you are sitting down. The water should only come up to your hips and should cover your buttocks. Breast care  Within the first few days after delivery, your breasts may feel heavy, full, and uncomfortable (breast engorgement). You may also have milk leaking from your breasts. Your health care provider can suggest ways to help relieve breast discomfort. Breast engorgement should go away within a few days.  If you are breastfeeding: ? Wear a bra that supports your breasts and fits you well. ? Keep your nipples clean and dry. Apply creams and ointments as told by your health care provider. ? You may need to use breast pads to absorb milk leakage. ? You may have uterine contractions every time you breastfeed for several weeks after delivery. Uterine contractions help your uterus return to its normal size. ? If you have any problems with breastfeeding, work with your health care provider or a Science writer.  If you are not breastfeeding: ? Avoid touching your breasts as this can make your breasts produce more milk. ? Wear a well-fitting bra and use cold packs to help with swelling. ? Do not squeeze out (express) milk. This causes you to make more milk. Intimacy and sexuality  Ask your health care provider when you can engage in sexual activity. This may depend on your: ? Risk of infection. ? Healing rate. ? Comfort and desire to engage in sexual activity.  You are  able to get pregnant after delivery, even if you have not had your period. If desired, talk with your health care provider about methods of family planning or birth control (contraception). Lifestyle  Do not use any products that contain nicotine or tobacco, such as cigarettes, e-cigarettes, and chewing tobacco. If you need help quitting, ask your health care provider.  Do not drink alcohol, especially if you are breastfeeding. Eating and drinking   Drink enough fluid to keep your urine pale yellow.  Eat high-fiber foods every day. These may help prevent or relieve constipation. High-fiber foods  include: ? Whole grain cereals and breads. ? Brown rice. ? Beans. ? Fresh fruits and vegetables.  Take your prenatal vitamins until your postpartum checkup or until your health care provider tells you it is okay to stop. General instructions  Keep all follow-up visits for you and your baby as told by your health care provider. Most women visit their health care provider for a postpartum checkup within the first 3-6 weeks after delivery. Contact a health care provider if you:  Feel unable to cope with the changes that a new baby brings to your life, and these feelings do not go away.  Feel unusually sad or worried.  Have breasts that are painful, hard, or turn red.  Have a fever.  Have trouble holding urine or keeping urine from leaking.  Have little or no interest in activities you used to enjoy.  Have not breastfed at all and you have not had a menstrual period for 12 weeks after delivery.  Have stopped breastfeeding and you have not had a menstrual period for 12 weeks after you stopped breastfeeding.  Have questions about caring for yourself or your baby.  Pass a blood clot from your vagina. Get help right away if you:  Have chest pain.  Have difficulty breathing.  Have sudden, severe leg pain.  Have severe pain or cramping in your abdomen.  Bleed from your vagina so much  that you fill more than one sanitary pad in one hour. Bleeding should not be heavier than your heaviest period.  Develop a severe headache.  Faint.  Have blurred vision or spots in your vision.  Have a bad-smelling vaginal discharge.  Have thoughts about hurting yourself or your baby. If you ever feel like you may hurt yourself or others, or have thoughts about taking your own life, get help right away. You can go to your nearest emergency department or call:  Your local emergency services (911 in the [place]  A suicide crisis helpline, such as the National Suicide Prevention Lifeline at 319 789 3991. This is open 24 hours a day. Summary  The period of time from when you deliver your baby to up to 6-12 weeks after delivery is called the postpartum period.  Gradually return to your normal activities as told by your health care provider.  Keep all follow-up visits for you and your baby as told by your health care provider. This information is not intended to replace advice given to you by your health care provider. Make sure you discuss any questions you have with your health care provider. Document Revised: 06/21/2018 Document Reviewed: 06/21/2018 Elsevier Patient Education  2020 [employer].

## 2020-01-01 NOTE — Lactation Note (Signed)
This note was copied from a baby's chart. Lactation Consultation Note  Patient Name: Denise [name] [id number] Date: 01/01/2020  [name] is 18 hours old in the NICU/  Mom is pumping every 2-3 hours and last obtained 25 mls.  She reports that breasts are comfortable.  She has ordered a pump from insurance and plans to rent until it arrives.  No questions or concerns.  Praised for efforts.   Maternal Data    Feeding Feeding Type: Donor Breast Milk  LATCH Score                   Interventions    Lactation Tools Discussed/Used     Consult Status      Huston Foley 01/01/2020, 11:15 AM

## 2020-01-03 ENCOUNTER — Ambulatory Visit: Payer: Self-pay

## 2020-01-03 NOTE — Lactation Note (Signed)
This note was copied from a baby's chart. Lactation Consultation Note  Patient Name: Denise [name] Date: 01/03/2020 Reason for consult: Follow-up assessment;Infant < 6lbs;Late-preterm 34-36.6wks;NICU baby;1st time breastfeeding;Primapara  LC in to assist with P1 Mom of baby in the NICU.  Baby 39 days old and Mom is pumping regularly and expressing about 2 oz per pumping.    Baby on NG tube feeding.  RN began feeding.  Baby rooting and Mom has him STS.   Positioned Mom and baby using pillow support for football hold.  Baby opening his mouth wide, but unable to attain a latch.  Initiated a 20 mm nipple shield showing Mom how to properly apply.  Nipple moved into shield about 1/4 of the way.  Mom expressed colostrum onto nipple, and baby opened wide and assisted baby to be brought quickly onto breast.  Mom instructed to firmly support her breast, shaping it in direction of baby's mouth.  Baby took several sucking bursts and one swallow identified.  Baby sleepy after about 10 mins and then got fussy.  Encouraged Mom to place baby on her chest and burp him.  Nipple noted to be pulled further into shield and colostrum drops in shield as well.  Baby should feed >30 mins so not to overtire baby.  Mom thrilled about baby's first latch to breast.  Encouraged Mom to continue to pump both breasts on regular setting for 15-20 mins.  Feeding Feeding Type: Breast Fed  LATCH Score Latch: Repeated attempts needed to sustain latch, nipple held in mouth throughout feeding, stimulation needed to elicit sucking reflex.  Audible Swallowing: A few with stimulation(1 swallow identified during feeding)  Type of Nipple: Everted at rest and after stimulation  Comfort (Breast/Nipple): Soft / non-tender  Hold (Positioning): Assistance needed to correctly position infant at breast and maintain latch.  LATCH Score: 7  Interventions Interventions: Breast feeding basics reviewed;Assisted with latch;Skin  to skin;Breast massage;Hand express;Breast compression;Adjust position;Support pillows;Position options;Expressed milk;DEBP  Lactation Tools Discussed/Used Tools: Pump;Nipple Dorris Carnes;Flanges Nipple shield size: 20 Flange Size: 24 Breast pump type: Double-Electric Breast Pump   Consult Status Consult Status: Follow-up Date: 01/04/20 Follow-up type: In-patient    [name] 01/03/2020, 3:40 PM

## 2020-01-04 ENCOUNTER — Ambulatory Visit (HOSPITAL_COMMUNITY): Payer: BC Managed Care – PPO

## 2020-01-11 ENCOUNTER — Other Ambulatory Visit: Payer: Self-pay

## 2020-01-11 ENCOUNTER — Ambulatory Visit (INDEPENDENT_AMBULATORY_CARE_PROVIDER_SITE_OTHER): Payer: BC Managed Care – PPO | Admitting: Obstetrics

## 2020-01-11 VITALS — BP 116/79 | HR 103 | Temp 97.7°F | Ht 64.0 in | Wt 222.0 lb

## 2020-01-11 DIAGNOSIS — Z98891 History of uterine scar from previous surgery: Secondary | ICD-10-CM

## 2020-01-11 DIAGNOSIS — Z9889 Other specified postprocedural states: Secondary | ICD-10-CM

## 2020-01-11 DIAGNOSIS — Z4889 Encounter for other specified surgical aftercare: Secondary | ICD-10-CM

## 2020-01-11 NOTE — Progress Notes (Addendum)
Subjective:     Denise [name] is a 34 y.o. female who presents to the clinic 2 weeks status post PP CSection for Incision Check. Eating a regular diet without difficulty. Bowel movements are normal. The patient is not having any pain.  Review of Systems Pertinent items are noted in HPI.    Objective:    BP 116/79 (BP Location: Left Arm, Cuff Size: Large)   Pulse (!) 103   Temp 97.7 F (36.5 C)   Ht 5\' 4"  (1.626 m)   Wt 222 lb (100.7 kg)   LMP 05/01/2019   Breastfeeding Yes   BMI 38.11 kg/m  General:  alert  Abdomen: soft, bowel sounds active, non-tender  Incision:   healing well, no drainage, no erythema, no hernia, no seroma, no swelling, no dehiscence, incision well approximated     Assessment:    Doing well postoperatively.   Plan:    1. Continue any current medications. 2. Wound care discussed. 3. Activity restrictions: none 4. Anticipated return to work: not applicable. 5. Follow up: 3 weeks for PP Visit.   05/03/2019, RMA   Patient seen and assessed by nursing staff during this encounter. I have reviewed the chart and agree with the documentation and plan.  Maretta Bees, MD 01/11/2020 10:34 AM

## 2020-01-16 DIAGNOSIS — Z3493 Encounter for supervision of normal pregnancy, unspecified, third trimester: Secondary | ICD-10-CM

## 2020-01-21 ENCOUNTER — Ambulatory Visit: Payer: Self-pay

## 2020-01-21 NOTE — Lactation Note (Signed)
This note was copied from a baby's chart. Lactation Consultation Note  Patient Name: Denise [name] OIODG'S Date: 01/21/2020 Reason for consult: Follow-up assessment;Primapara;NICU baby  Infant is 44 weeks old. Lilia Pro, RN reports that breastfeeding is going really well and that depending on infant's weight gain, could be discharged home tomorrow.  I stopped by Mom's room & Mom agrees that breastfeeding is going great. I did answer her one question, which was in reference to her infant's preference for her L breast over her R breast (Mom's L breast produces more than the R). This has not caused any issues with breast management for mother.  An MD dropped by to perform the infant's circumcision so I only met briefly with parents. However, Mom had no other questions and Mom has a Spectra pump at home. When Mom pumps, she tends to express about 3 - 3.5oz/session.   Matthias Hughs [facility] 01/21/2020, 3:25 PM

## 2020-01-28 ENCOUNTER — Encounter: Payer: Self-pay | Admitting: Obstetrics and Gynecology

## 2020-01-28 ENCOUNTER — Ambulatory Visit (INDEPENDENT_AMBULATORY_CARE_PROVIDER_SITE_OTHER): Payer: BC Managed Care – PPO | Admitting: Obstetrics and Gynecology

## 2020-01-28 ENCOUNTER — Other Ambulatory Visit: Payer: Self-pay

## 2020-01-28 NOTE — Progress Notes (Signed)
Subjective:     Denise [name] is a 34 y.o. female who presents for a postpartum visit. She is 4 weeks postpartum following a low cervical transverse Cesarean section. I have fully reviewed the prenatal and intrapartum course. The delivery was at 50 gestational weeks CHTN with superimposed pre-eclampsia and IUGR. Outcome: primary cesarean section, low transverse incision. Anesthesia: spinal. Postpartum course has been uncomplicated. Baby's course has been uncomplicated with the exception of prolonged NICU stay due to prematurity. Baby is feeding by both breast and bottle - [place]. Bleeding no bleeding. Bowel function is normal. Bladder function is normal. Patient is not sexually active. Contraception method is condoms. Postpartum depression screening: negative.     Review of Systems Pertinent items noted in HPI and remainder of comprehensive ROS otherwise negative.   Objective:    BP 120/82   Pulse 80   Wt 220 lb (99.8 kg)   LMP 05/01/2019   BMI 37.76 kg/m   General:  alert, cooperative and no distress   Breasts:  inspection negative, no nipple discharge or bleeding, no masses or nodularity palpable  Lungs: clear to auscultation bilaterally  Heart:  regular rate and rhythm  Abdomen: soft, non-tender; bowel sounds normal; no masses,  no organomegaly. Incision healed without erythema, induration or drainage   Vulva:  not evaluated  Vagina: not evaluated  Cervix:    Corpus: not examined  Adnexa:  not evaluated  Rectal Exam: Not performed.        Assessment:     Normal postpartum exam. Pap smear not done at today's visit.   Plan:    1. Contraception: condoms 2. Patient is medically cleared to resume all activities of daily living. Patient to continue with nifedipine and to follow up with PCP 3. Follow up in: 7 months for annual exam or as needed.

## 2020-06-10 ENCOUNTER — Ambulatory Visit (INDEPENDENT_AMBULATORY_CARE_PROVIDER_SITE_OTHER): Payer: Self-pay

## 2020-06-10 ENCOUNTER — Other Ambulatory Visit: Payer: Self-pay

## 2020-06-10 DIAGNOSIS — Z3202 Encounter for pregnancy test, result negative: Secondary | ICD-10-CM

## 2020-06-10 DIAGNOSIS — Z3200 Encounter for pregnancy test, result unknown: Secondary | ICD-10-CM

## 2020-06-10 LAB — POCT URINE PREGNANCY: Preg Test, Ur: NEGATIVE

## 2020-06-10 NOTE — Progress Notes (Signed)
Agree with A & P. 

## 2020-06-10 NOTE — Progress Notes (Signed)
Pt is in the office for possible pregnancy, reports that she is currently breastfeeding. Pt states LMP 05-14-20, and she had multiple positive pregnancy tests starting on 05-30-20. Pt reports that she took another UPT on 06-06-20 and it was negative, test in office today also showed negative result. Pt denies any bleeding, cramping, or pain, advised to follow up if abnormal symptoms occur.

## 2020-07-15 ENCOUNTER — Other Ambulatory Visit: Payer: Self-pay

## 2020-07-15 DIAGNOSIS — Z20822 Contact with and (suspected) exposure to covid-19: Secondary | ICD-10-CM

## 2020-07-16 LAB — NOVEL CORONAVIRUS, NAA: SARS-CoV-2, NAA: NOT DETECTED

## 2020-07-18 ENCOUNTER — Other Ambulatory Visit: Payer: Self-pay

## 2020-08-14 ENCOUNTER — Encounter: Payer: Self-pay | Admitting: Nurse Practitioner

## 2020-08-14 ENCOUNTER — Ambulatory Visit (INDEPENDENT_AMBULATORY_CARE_PROVIDER_SITE_OTHER): Payer: BC Managed Care – PPO | Admitting: Nurse Practitioner

## 2020-08-14 ENCOUNTER — Other Ambulatory Visit: Payer: Self-pay

## 2020-08-14 ENCOUNTER — Encounter: Payer: Self-pay | Admitting: Obstetrics

## 2020-08-14 VITALS — BP 135/95 | HR 90

## 2020-08-14 DIAGNOSIS — R030 Elevated blood-pressure reading, without diagnosis of hypertension: Secondary | ICD-10-CM | POA: Diagnosis not present

## 2020-08-14 DIAGNOSIS — N644 Mastodynia: Secondary | ICD-10-CM | POA: Diagnosis not present

## 2020-08-14 NOTE — Progress Notes (Signed)
GYNECOLOGY OFFICE VISIT NOTE   History:  34 y.o. [id number] here today for pain in her left breast and pumping out blood when she pumps. Baby was born premature and was in NICU but has been feedling well at the breast - now 6 months of age and is not taking any other food beyond breastmilk.  Brought photos of the pumped milk - red blood for approx 8-10cc and then the blood clears and she pumps appropriate milk.  Is not feeling the bloody milk.  She is having pain deep in the breast during feedings and between feedings.  No fever, no redness of the breast, nipple tenderness primarily on the left and blood pumped from the left side only. She denies any abnormal vaginal discharge, bleeding, pelvic pain or other concerns. BP is elevated.  Has PCP who saw her postpartum and stopped her BP medication.  Had Covid in July 2021 and milk supply went down.  She is getting her supply back but then the pain and bleeding when pumping was seen.  Past Medical History:  Diagnosis Date   Complication of anesthesia    Kidney stone    Malignant hyperthermia    paternal grandfather passed from [facility]   Migraine     Past Surgical History:  Procedure Laterality Date   CESAREAN SECTION N/A 12/29/2019   Procedure: CESAREAN SECTION;  Surgeon: Catalina Antigua, MD;  Location: [facility] LD ORS;  Service: Obstetrics;  Laterality: N/A;   KIDNEY STONE SURGERY  2014   L and R     The following portions of the patient's history were reviewed and updated as appropriate: allergies, current medications, past family history, past medical history, past social history, past surgical history and problem list.     Review of Systems:  Pertinent items noted in HPI and remainder of comprehensive ROS otherwise negative.  Objective:  Physical Exam BP (!) 135/95    Pulse 90    LMP 07/22/2020  CONSTITUTIONAL: Well-developed, well-nourished female in no acute distress.  HENT:  Normocephalic, atraumatic. External right and left ear  normal.  EYES: Conjunctivae and EOM are normal. Pupils are equal, round.  No scleral icterus.  NECK: Normal range of motion, supple, no masses SKIN: Skin is warm and dry. No rash noted. Not diaphoretic. No erythema. No pallor. NEUROLOGIC: Alert and oriented to person, place, and time. Normal muscle tone coordination. No cranial nerve deficit noted. PSYCHIATRIC: Normal mood and affect. Normal behavior. Normal judgment and thought content. CARDIOVASCULAR: Normal heart rate noted RESPIRATORY: Effort and breath sounds normal, no problems with respiration noted ABDOMEN: Soft, no distention noted.   PELVIC: Deferred MUSCULOSKELETAL: Normal range of motion. No edema noted. BREAST - left breast has no redness or streaking, with palpation, there is pain with palpation deep in the breast.  No lumps felt.  No tenderness on right side.  Right nipple pink and has very light peeling edge seen in circumference around the nipple - very subtle.  Left nipple even more pink but not quite red but has a 63mm line in the middle that client states just came up today.  Has fed on both sides and pumped on both sides but had to stop feeding and pumping on the left side because it hurt so much.  Labs and Imaging No results found.  Assessment & Plan:  1. Breast pain, left and nipple damage mild seen on left nipple Differential diagnosis - nipple yeast, nipple bleeding vs. Blood from breast, mastitis, rusty pipe syndrome, intraductal papilloma.  Advised to keep the breast well drained.  Use coconut oil on the nipple and on the pump flange.  Use ice or warm compresses to decrease pain before pumping.  Call if you have any fever or streaks that form in the breast.  Expect the blood in the milk to be a temporary problem otherwise further exam of the breast at the Breast Center would be advised.  Baby does not have signs of thrush and mother does not have signs of mastitis at this time.  Will use these comfort measures and close  monitoring to see if the bleeding will resolve.  Will have client stay out of work tomorrow and see Advertising copywriter. - Ambulatory referral to Lactation  Elevated BP without diagnosis of hypertension Client thinks her BP is elevated due to stress and breast pain today.  Advised to follow up with PCP as having Covid sometimes can cause BP to be elevated.  With preterm delivery due to hypertension, she will be at risk of early cardiovascular disease.  Client agrees to follow up with PCP.  Routine preventative health maintenance measures emphasized. Please refer to After Visit Summary for other counseling recommendations.   Return if symptoms worsen or fail to improve.   Total face-to-face time with patient: 15 minutes.  Over 50% of encounter was spent on counseling and coordination of care.  Nolene Bernheim, RN, MSN, NP-BC Nurse Practitioner, [facility] for [employer], [facility] 08/14/2020 4:19 PM

## 2020-08-14 NOTE — Progress Notes (Signed)
Patient reports left painful breast pain followed by some bleeding at the site x 3 weeks.

## 2020-08-20 ENCOUNTER — Telehealth (INDEPENDENT_AMBULATORY_CARE_PROVIDER_SITE_OTHER): Payer: BC Managed Care – PPO | Admitting: Lactation Services

## 2020-08-20 ENCOUNTER — Other Ambulatory Visit: Payer: Self-pay | Admitting: Obstetrics and Gynecology

## 2020-08-20 DIAGNOSIS — O9229 Other disorders of breast associated with pregnancy and the puerperium: Secondary | ICD-10-CM

## 2020-08-20 NOTE — Addendum Note (Signed)
Addended by: Ed Blalock on: 08/20/2020 11:10 AM   Modules accepted: Orders

## 2020-08-20 NOTE — Telephone Encounter (Addendum)
Patient called back and LM.   Returned patients call.   She reports that mid-late August she and husband got Covid. Her supply decreased drastically. Infant cluster fed and supply came back.   Nipples became tender at that time, most likely from cluster feeding. 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 She was not able to pump as often at work due to work flow for about 1-1.5 weeks. She sometimes was not able to pump for her entire 8 hour work day. Breast pain started at that time.   Nipples are not cracked or bruised. She pumped about 1.5 ounces of blood in the morning mainly and then milk clears up. She discards to bloody milk. She has not had bleeding in the last 2-3 days. Left side is where the pain is and is excruciating with feeding or pumping with pain of 8/10. Nipple looks normal after latch. She is taking Ibuprofen for pain.   When breast pain was new, she had a burning shooting pain in her breast tissue. She burning shooting has improved and almost gone. Pain is deep in the breast. She does not feel lumps in the breast and no fever or flu like symptoms.  Says it feels like her chest is bruised. She was doing warm and cold compresses, hot showers and massage. She denies milk bleb to nipple although description of pain is consistent with milk bleb or plugged duct. Reviewed care for milk bleb if seen post nursing or pumping.   Infant wakes up twice at night and dad gives bottles. Mom pumps at 11 pm and 5 pm. At work she is able to pump every 3-4 hours and usually pumps 3 times while at work. 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   She resumed her cycles at about 4-5  Months and was not associated at the time the pain began.   Suspect patient has , plugged duct, some deep tissue bruising from deep massage. or possible papilloma. She has had a breast exam recently and was unremarkable except for pain. Spoke with Raelyn Mora, CNM.  Will obtain US and then follow up as  needed.   Patient agreeable to [place] at first available. Called [facility] and scheduled appt for 10/26 at 7:40 am. Patient can call the office to see if any cancellations by calling [phone].   Called patient back and lm on her voicemail (she gave approval when I spoke with her earlier)  informing her of appointment date, time and location.  Left l Breast [facility] number that she can call them to see if any cancellations. Advised patient to call the office back as needed with questions or concerns.

## 2020-08-20 NOTE — Telephone Encounter (Signed)
Called patient to discuss breast concerns. She did not answer. LM for patient to call the office at her convenience.   Referral for infant has been sent out to infant's Pediatrician and waiting for it to come back to get patient scheduled.   Will try again at a later time.

## 2020-09-09 ENCOUNTER — Ambulatory Visit
Admission: RE | Admit: 2020-09-09 | Discharge: 2020-09-09 | Disposition: A | Discharge status: Home / Self Care | Payer: BC Managed Care – PPO | Source: Ambulatory Visit | Attending: Obstetrics and Gynecology | Admitting: Obstetrics and Gynecology

## 2020-09-09 ENCOUNTER — Other Ambulatory Visit: Payer: Self-pay | Admitting: Obstetrics and Gynecology

## 2020-09-09 ENCOUNTER — Other Ambulatory Visit: Payer: Self-pay

## 2020-09-09 DIAGNOSIS — O9229 Other disorders of breast associated with pregnancy and the puerperium: Secondary | ICD-10-CM

## 2020-09-09 DIAGNOSIS — N644 Mastodynia: Secondary | ICD-10-CM

## 2020-10-14 ENCOUNTER — Telehealth: Payer: Self-pay

## 2020-10-14 NOTE — Telephone Encounter (Signed)
A voicemail was left for the patient to contact the office for an appointment.

## 2021-06-11 ENCOUNTER — Other Ambulatory Visit: Payer: Self-pay

## 2021-06-11 ENCOUNTER — Ambulatory Visit (INDEPENDENT_AMBULATORY_CARE_PROVIDER_SITE_OTHER): Payer: Self-pay

## 2021-06-11 VITALS — Wt 205.0 lb

## 2021-06-11 DIAGNOSIS — Z3202 Encounter for pregnancy test, result negative: Secondary | ICD-10-CM

## 2021-06-11 LAB — POCT URINE PREGNANCY: Preg Test, Ur: NEGATIVE

## 2021-06-11 NOTE — Progress Notes (Signed)
Denise [name] presents today for UPT. She has no unusual complaints.    OBJECTIVE: Appears well, in no apparent distress.  OB History     Gravida  5   Para  1   Term      Preterm  1   AB  4   Living  1      SAB  3   IAB      Ectopic  1   Multiple  0   Live Births  1        Obstetric Comments  One adopted daughter         Home UPT Result: positive In-Office UPT result:NEGATIVE  I have reviewed the patient's medical, obstetrical, social, and family histories, and medications.   ASSESSMENT: NEGATIVE pregnancy test. Period should have started yesterday.  PLAN: RTO in 2 weeks for UPT.

## 2021-08-08 IMAGING — US US MFM UA CORD DOPPLER
1 series · 13 of 28 positions shown · non-contrast
Comparison: none

[Series 1: us mfm ua cord doppler · 36 acquisitions, 13 frames shown]
[im 2/36]
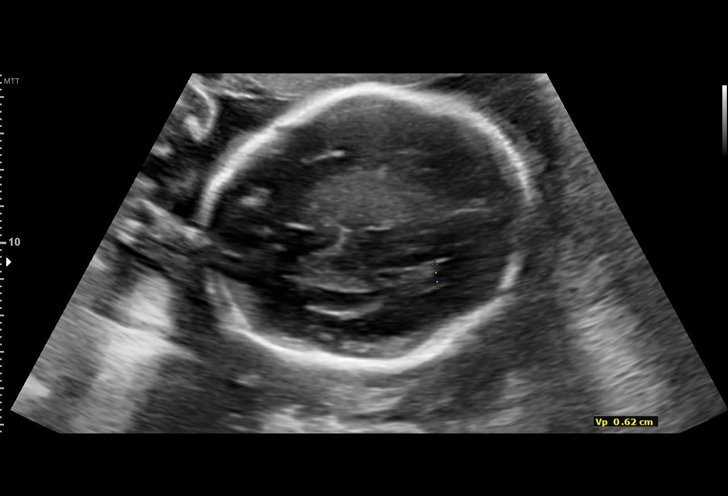
[im 4/36]
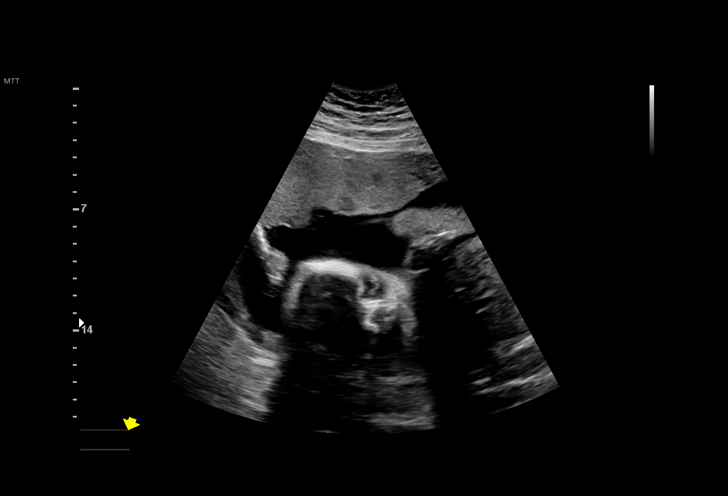
[im 7/36]
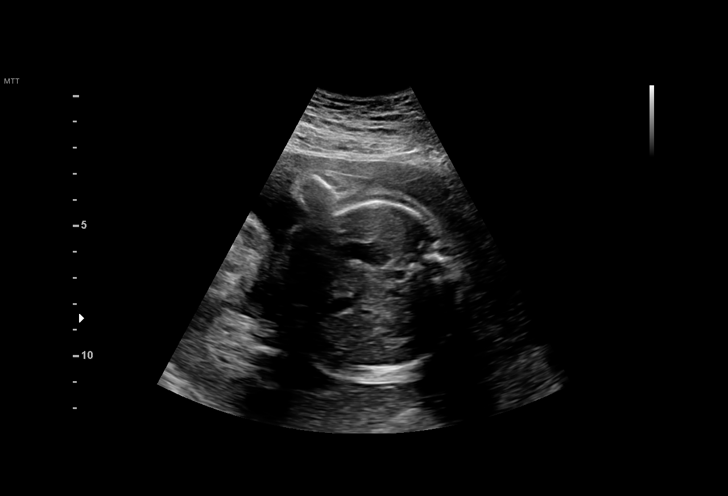
[im 10/36]
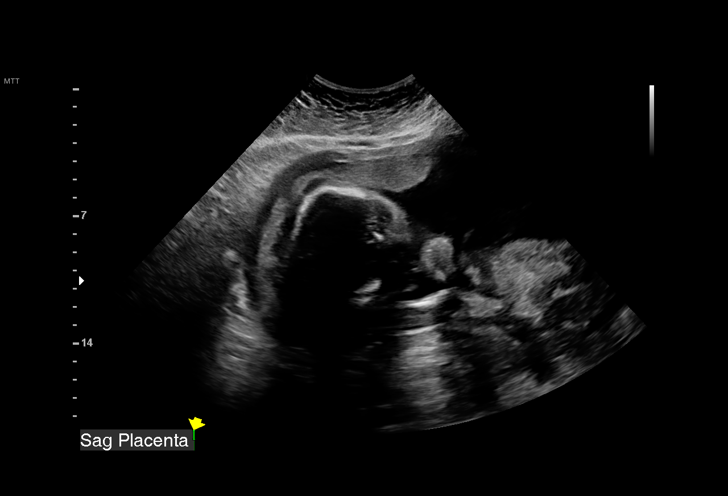
[im 12/36]
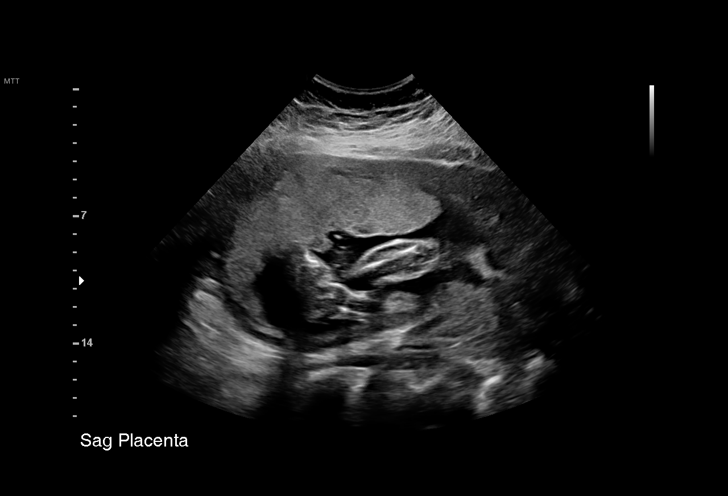
[im 15/36]
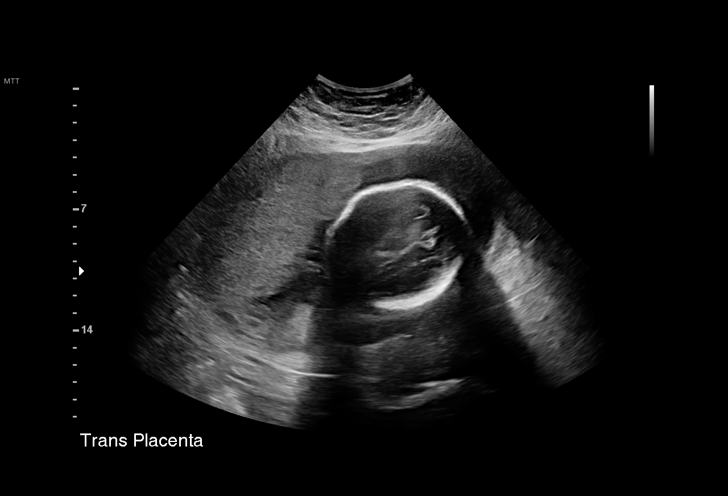
[im 19/36]
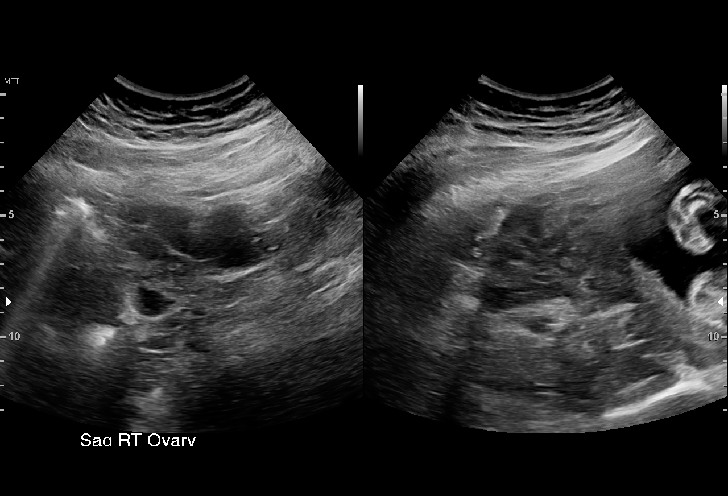
[im 21/36]
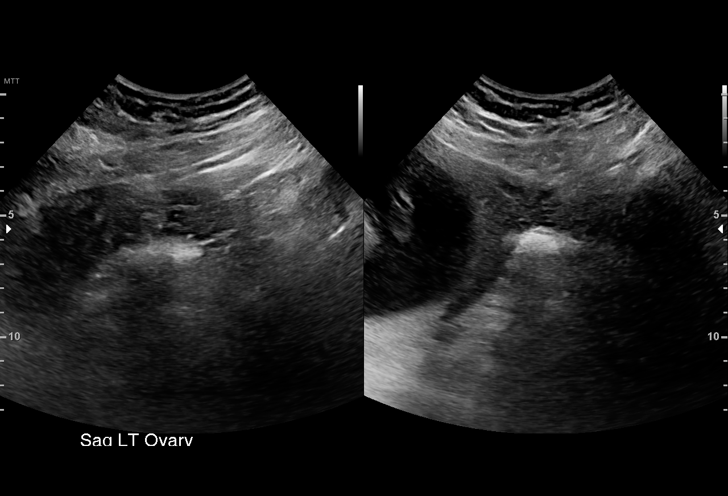
[im 24/36]
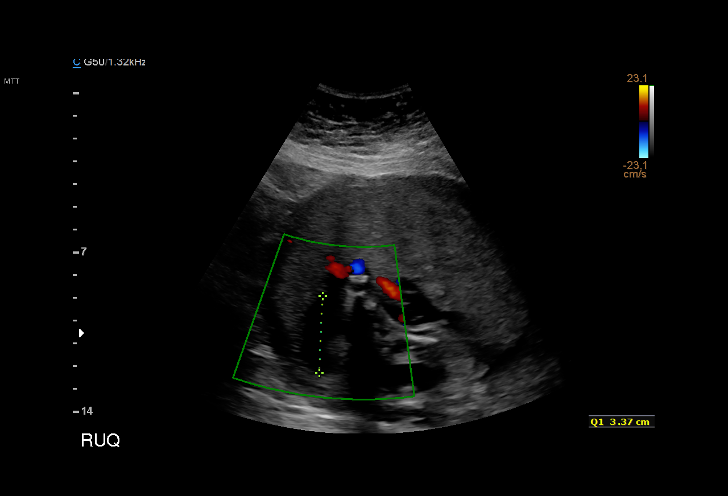
[im 26/36]
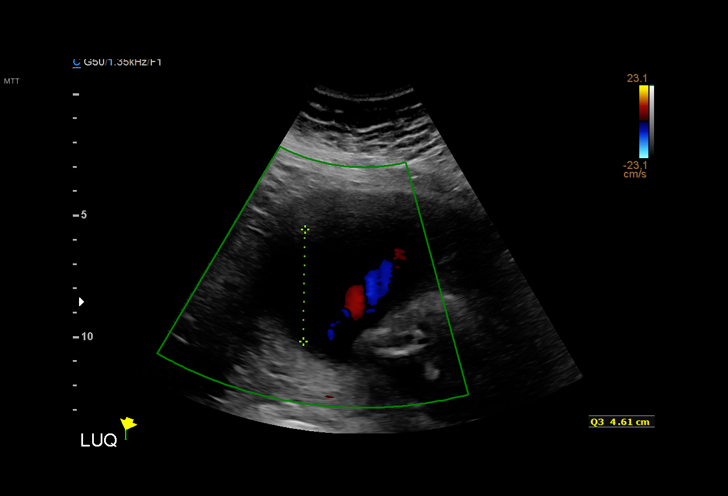
[im 29/36]
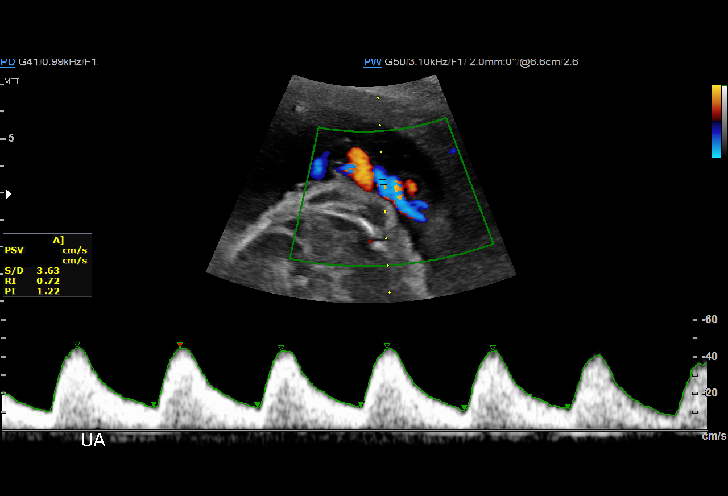
[im 32/36]
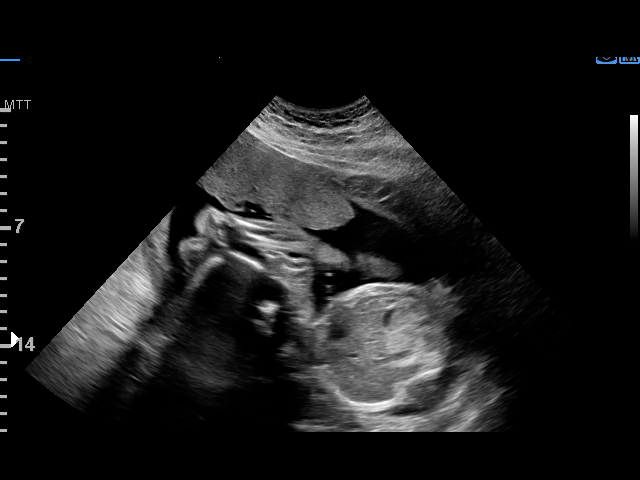
[im 34/36]
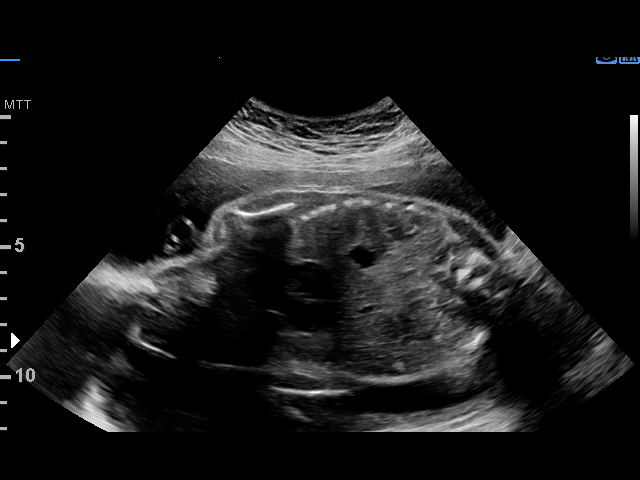

[13 of 28 positions shown; findings below may reference images not displayed]

----------------------------------------------------------------------

 ----------------------------------------------------------------------
Indications

  Maternal care for known or suspected poor
  fetal growth, third trimester, fetus 1 IUGR
  Obesity complicating pregnancy, third
  trimester  (Pre Pregnancy BMI 37)
  28 weeks gestation of pregnancy
  Encounter for other antenatal screening
  follow-up (low risk NIPS)
  Hypertension - Chronic/Pre-existing (ASA)
  Poor obstetric history-Recurrent (habitual)
  abortion (3 consecutive ab's)
  Fetal abnormality - other known or
  suspected (Bilat CPC, EIFLV)
  Maternal Clotting Disorder (taking Lovanox)
  Uterine fibroids
 ----------------------------------------------------------------------
Vital Signs

                                                Height:        5'4"
Fetal Evaluation

 Num Of Fetuses:         1
 Fetal Heart Rate(bpm):  153
 Cardiac Activity:       Observed
 Presentation:           Frank breech
 Placenta:               Anterior Fundal
 P. Cord Insertion:      Previously Visualized

 Amniotic Fluid
 AFI FV:      Within normal limits

 AFI Sum(cm)     %Tile       Largest Pocket(cm)
 16              58
 RUQ(cm)       RLQ(cm)       LUQ(cm)        LLQ(cm)

Biometry

 LV:        6.2  mm
OB History

 Gravidity:    5          SAB:   3
 Ectopic:      1        Living:  0
Gestational Age

 LMP:           28w 2d        Date:  05/01/19                 EDD:   02/05/20
 Best:          28w 2d     Det. By:  LMP  (05/01/19)          EDD:   02/05/20
Anatomy

 Cranium:               Previously seen        LVOT:                   Previously seen
 Cavum:                 Previously seen        Aortic Arch:            Previously seen
 Ventricles:            Appears normal         Ductal Arch:            Not well visualized
 Choroid Plexus:        Resolved CPCs          Diaphragm:              Previously seen
                        prev.
 Cerebellum:            Previously seen        Stomach:                Previously Seen
 Posterior Fossa:       Previously seen        Abdomen:                Previously seen
 Nuchal Fold:           Previously seen        Abdominal Wall:         Previously seen
 Face:                  Orbits and profile     Cord Vessels:           Previously seen
                        previously seen
 Lips:                  Previously seen        Kidneys:                Previously seen
 Palate:                Not well visualized    Bladder:                Previously seen
 Thoracic:              Previously seen        Spine:                  Previously seen
 Heart:                 Echogenic focus        Upper Extremities:      Previously seen
                        previously seen
 RVOT:                  Previously seen        Lower Extremities:      Previously seen

 Other:  Male gender previously seen. Nasal bone previously seen. Heels
         previously seen. Technically difficult due to maternal habitus and fetal
         position.
Doppler - Fetal Vessels

 Umbilical Artery
  S/D     %tile     RI              PI                     ADFV    RDFV
 4.18       93   0.76             1.33                        No      No

Cervix Uterus Adnexa

 Cervix
 Not visualized (advanced GA >74wks)

 Uterus
 Single fibroid noted, see table below.

 Left Ovary
 Within normal limits. No adnexal mass visualized.

 Right Ovary
 Within normal limits. No adnexal mass visualized.
 Cul De Sac
 No free fluid seen.

 Adnexa
 No abnormality visualized.
Myomas

  Site                     L(cm)      W(cm)      D(cm)      Location
  Fundus Left              4.51       4.46       3.82       Intramural
 ----------------------------------------------------------------------

  Blood Flow                 RI        PI       Comments

 ----------------------------------------------------------------------
Comments

 This patient was seen due to an IUGR fetus.  She denies any
 problems since her last exam.
 There was normal amniotic fluid noted on today's ultrasound
 exam.  Fetal movements and fetal breathing movements
 were also noted throughout today's exam.
 Doppler studies of the umbilical arteries performed due to
 fetal growth restriction showed an elevated S/D ratio of 4.18.
 A follow-up exam was scheduled in 1 week.

## 2021-08-22 IMAGING — US US MFM UA CORD DOPPLER
1 series · 15 of 22 positions shown · non-contrast
Comparison: none

[Series 1: us mfm ua cord doppler · 22 acquisitions, 15 frames shown]
[im 1/22]
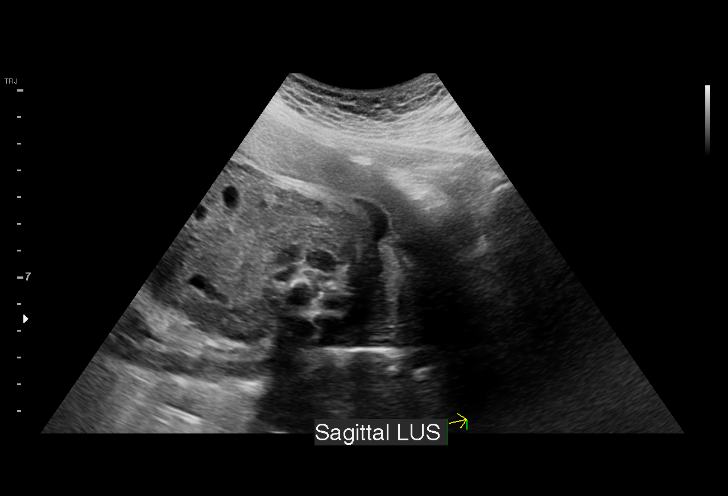
[im 3/22]
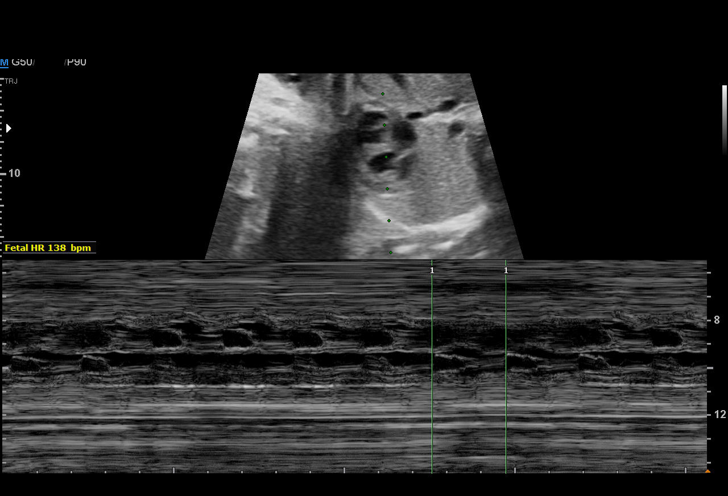
[im 4/22]
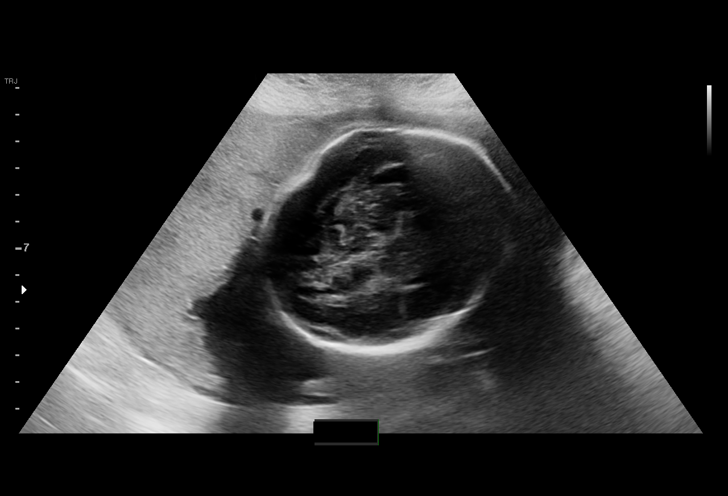
[im 6/22]
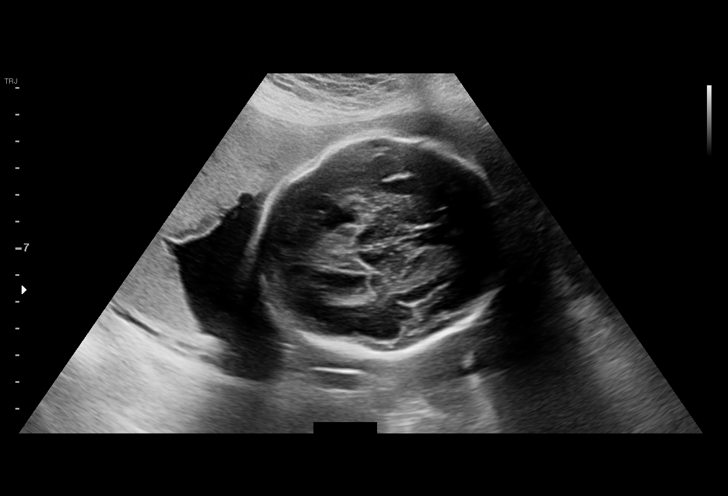
[im 7/22]
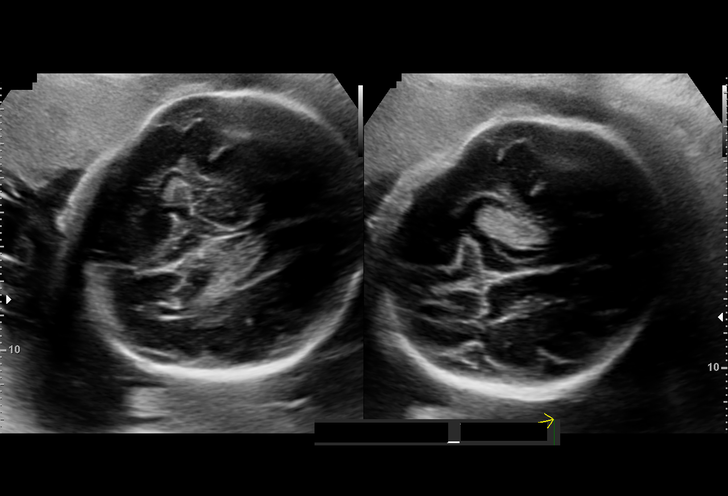
[im 9/22]
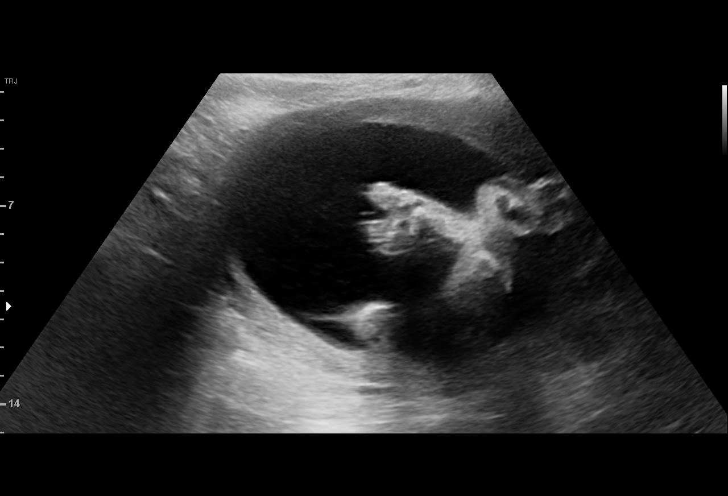
[im 10/22]
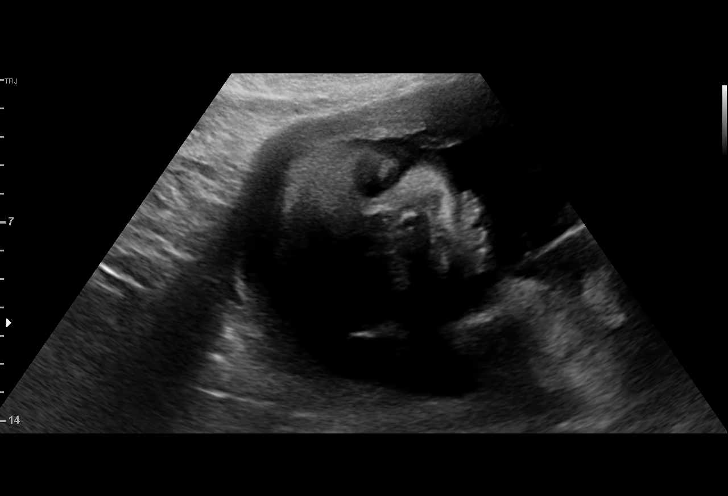
[im 12/22]
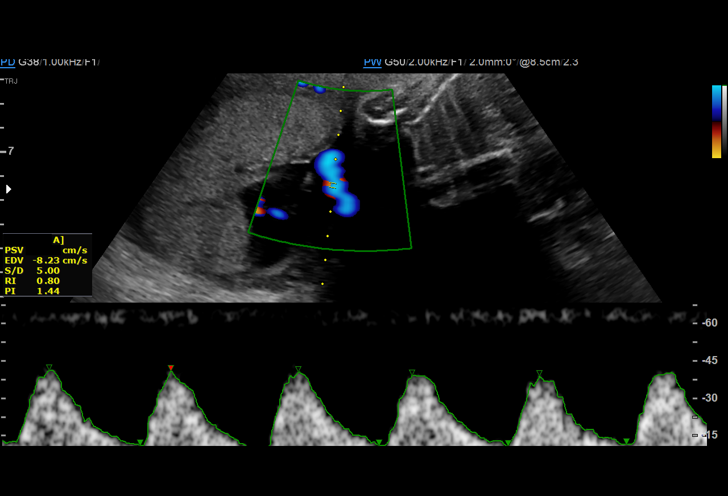
[im 13/22]
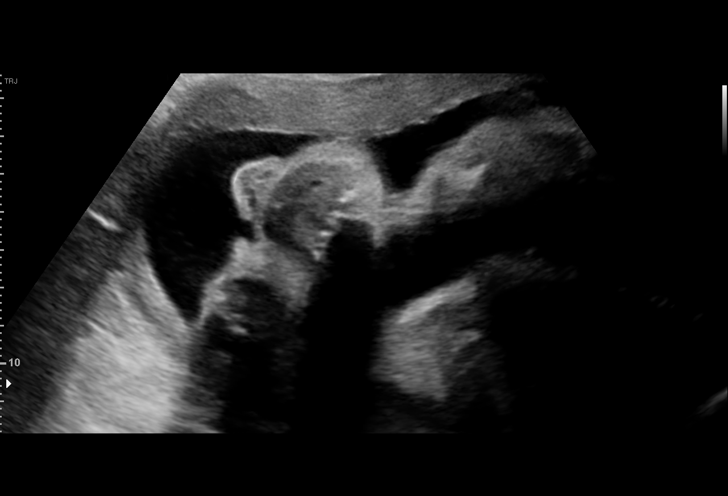
[im 14/22]
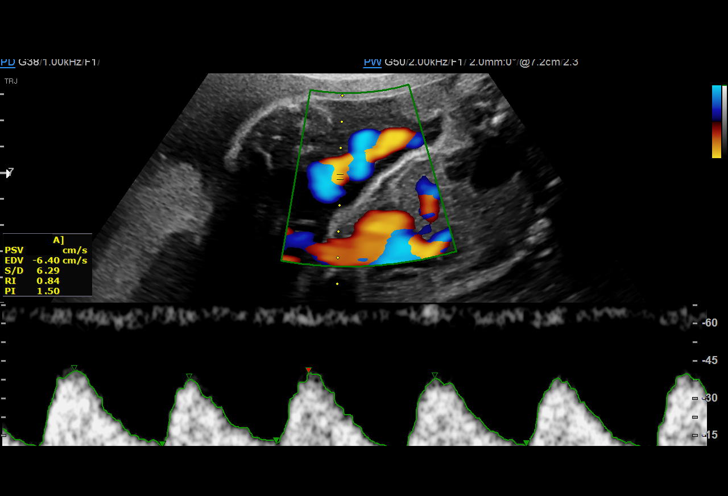
[im 16/22]
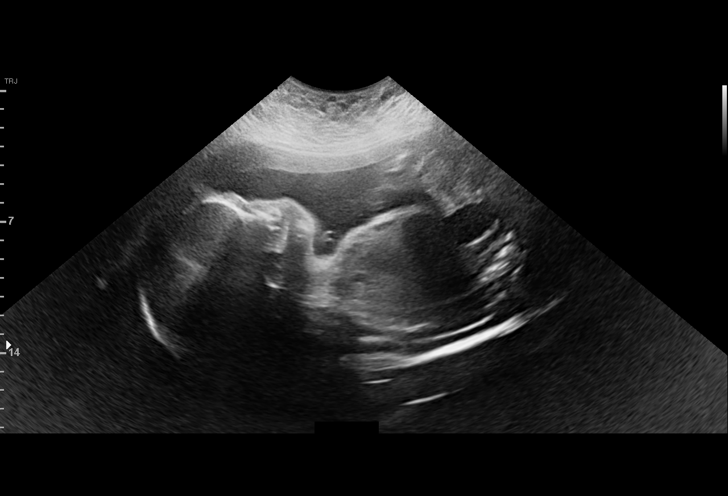
[im 17/22]
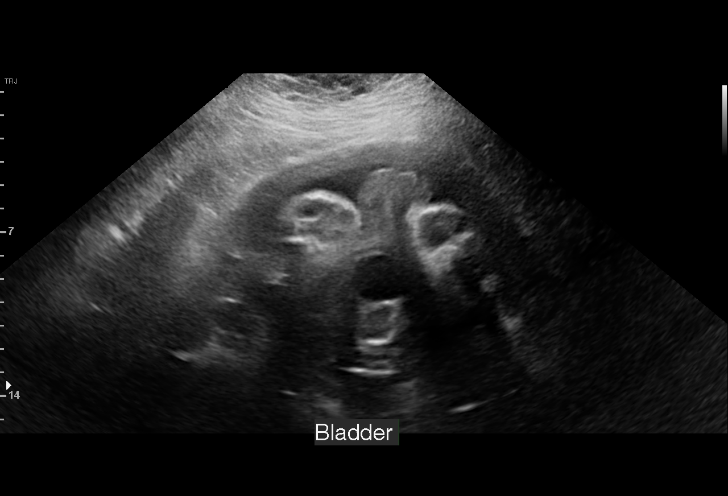
[im 19/22]
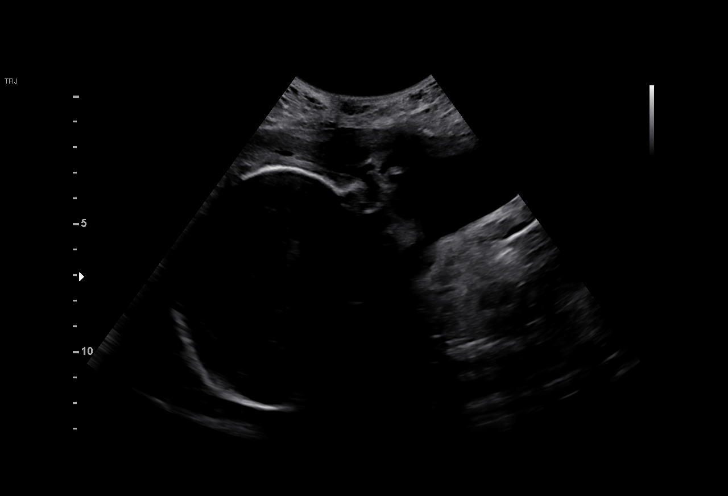
[im 20/22]
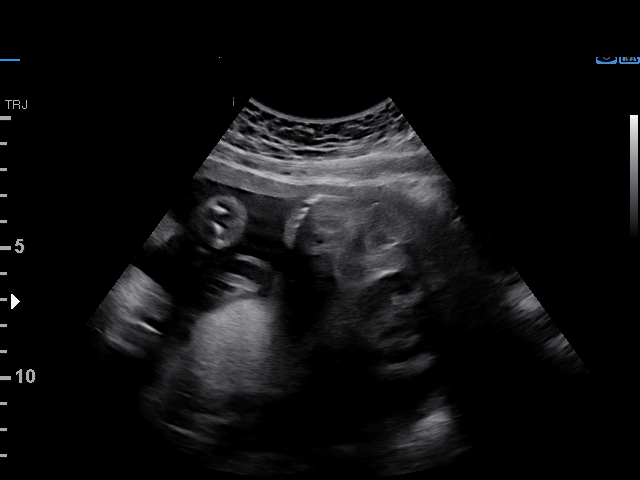
[im 22/22]
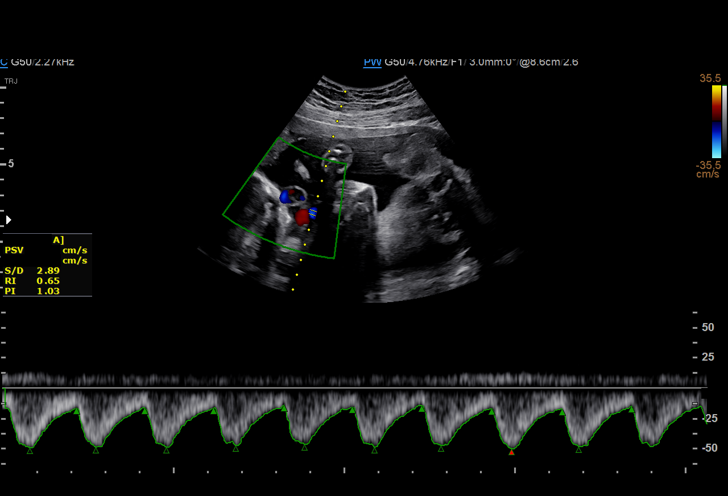

[15 of 22 positions shown; findings below may reference images not displayed]

----------------------------------------------------------------------

 ----------------------------------------------------------------------
Indications

  Maternal care for known or suspected poor
  fetal growth, third trimester, not applicable or
  unspecified IUGR
  30 weeks gestation of pregnancy
  Obesity complicating pregnancy, third
  trimester  (Pre Pregnancy BMI 37)
  Hypertension - Chronic/Pre-existing (ASA)
  Poor obstetric history-Recurrent (habitual)
  abortion (3 consecutive ab's)
  Fetal abnormality - other known or
  suspected (Bilat CPC, EIFLV)
  Maternal Clotting Disorder (taking Lovanox)
  Uterine fibroids
 ----------------------------------------------------------------------
Vital Signs

                                                Height:        5'4"
Fetal Evaluation

 Num Of Fetuses:         1
 Fetal Heart Rate(bpm):  138
 Cardiac Activity:       Observed
 Presentation:           Breech
 Placenta:               Right lateral

 Amniotic Fluid
 AFI FV:      Subjectively within normal limits
OB History

 Gravidity:    5          SAB:   3
 Ectopic:      1        Living:  0
Gestational Age

 LMP:           30w 2d        Date:  05/01/19                 EDD:   02/05/20
 Best:          30w 2d     Det. By:  LMP  (05/01/19)          EDD:   02/05/20
Doppler - Fetal Vessels

 Umbilical Artery
  S/D     %tile     RI              PI                     ADFV    RDFV
 5.08    > 97.5    0.8            1.41                        No      No

Comments

 This patient was seen due to an IUGR fetus.  She denies any
 problems since her last exam.
 The patient had a reactive nonstress test today.
 There was normal amniotic fluid noted on today's ultrasound
 exam.  Fetal movements were noted throughout today's
 ultrasound exam.
 Doppler studies of the umbilical arteries performed due to
 fetal growth restriction showed an elevated S/D ratio of 5.08.
 There were no signs of absent or reversed end-diastolic flow.
 A follow-up exam was scheduled in 1 week.

## 2021-12-11 ENCOUNTER — Other Ambulatory Visit: Payer: Self-pay

## 2021-12-11 ENCOUNTER — Inpatient Hospital Stay (HOSPITAL_COMMUNITY): Payer: 59

## 2021-12-11 ENCOUNTER — Encounter (HOSPITAL_COMMUNITY): Payer: Self-pay

## 2021-12-11 ENCOUNTER — Inpatient Hospital Stay (HOSPITAL_COMMUNITY)
Admission: AD | Admit: 2021-12-11 | Discharge: 2021-12-11 | Disposition: A | Discharge status: Home / Self Care | Payer: 59 | Attending: Obstetrics and Gynecology | Admitting: Obstetrics and Gynecology

## 2021-12-11 DIAGNOSIS — O26891 Other specified pregnancy related conditions, first trimester: Secondary | ICD-10-CM | POA: Insufficient documentation

## 2021-12-11 DIAGNOSIS — O3680X0 Pregnancy with inconclusive fetal viability, not applicable or unspecified: Secondary | ICD-10-CM | POA: Diagnosis not present

## 2021-12-11 DIAGNOSIS — Z3A00 Weeks of gestation of pregnancy not specified: Secondary | ICD-10-CM | POA: Diagnosis not present

## 2021-12-11 DIAGNOSIS — Z3A01 Less than 8 weeks gestation of pregnancy: Secondary | ICD-10-CM | POA: Insufficient documentation

## 2021-12-11 DIAGNOSIS — R102 Pelvic and perineal pain: Secondary | ICD-10-CM | POA: Insufficient documentation

## 2021-12-11 DIAGNOSIS — O209 Hemorrhage in early pregnancy, unspecified: Secondary | ICD-10-CM

## 2021-12-11 LAB — POCT PREGNANCY, URINE: Preg Test, Ur: POSITIVE — AB

## 2021-12-11 LAB — URINALYSIS, ROUTINE W REFLEX MICROSCOPIC
Bilirubin Urine: NEGATIVE
Glucose, UA: NEGATIVE mg/dL
Hgb urine dipstick: NEGATIVE
Ketones, ur: 5 mg/dL — AB
Leukocytes,Ua: NEGATIVE
Nitrite: NEGATIVE
Protein, ur: NEGATIVE mg/dL
Specific Gravity, Urine: 1.004 — ABNORMAL LOW (ref 1.005–1.030)
pH: 6 (ref 5.0–8.0)

## 2021-12-11 LAB — CBC
HCT: 37.5 % (ref 36.0–46.0)
Hemoglobin: 12.1 g/dL (ref 12.0–15.0)
MCH: 25.6 pg — ABNORMAL LOW (ref 26.0–34.0)
MCHC: 32.3 g/dL (ref 30.0–36.0)
MCV: 79.4 fL — ABNORMAL LOW (ref 80.0–100.0)
Platelets: 223 10*3/uL (ref 150–400)
RBC: 4.72 MIL/uL (ref 3.87–5.11)
RDW: 17.4 % — ABNORMAL HIGH (ref 11.5–15.5)
WBC: 7.7 10*3/uL (ref 4.0–10.5)
nRBC: 0 % (ref 0.0–0.2)

## 2021-12-11 LAB — WET PREP, GENITAL
Clue Cells Wet Prep HPF POC: NONE SEEN
Sperm: NONE SEEN
Trich, Wet Prep: NONE SEEN
WBC, Wet Prep HPF POC: <10 (ref <10)
Yeast Wet Prep HPF POC: NONE SEEN

## 2021-12-11 LAB — HCG, QUANTITATIVE, PREGNANCY: hCG, Beta Chain, Quant, S: 2965 m[IU]/mL — ABNORMAL HIGH (ref <5)

## 2021-12-11 MED ORDER — RHO D IMMUNE GLOBULIN 1500 UNIT/2ML IJ SOSY
300.0000 ug | PREFILLED_SYRINGE | Freq: Once | INTRAMUSCULAR | Status: AC
  Administered 2021-12-11: 300 ug via INTRAMUSCULAR
  Filled 2021-12-11: qty 2

## 2021-12-11 NOTE — MAU Note (Signed)
Blind swabs collected by patient °

## 2021-12-11 NOTE — Discharge Instructions (Signed)

## 2021-12-11 NOTE — MAU Note (Signed)
Denise [name] is a 36 y.o. at [redacted]w[redacted]d here in MAU reporting: started spotting today, is not having a wear a pad. States she has seen it twice today when she wipes. Intermittent cramping for the past week, no pain currently.  LMP: 11/10/21  Onset of complaint: today  Pain score: 0/10  Vitals:   12/11/21 1434  BP: (!) 147/83  Pulse: 91  Resp: 16  Temp: 98.9 F (37.2 C)  SpO2: 100%     Lab orders placed from triage: upt, ua

## 2021-12-12 LAB — RH IG WORKUP (INCLUDES ABO/RH)
ABO/RH(D): A NEG
Antibody Screen: NEGATIVE
Gestational Age(Wks): 4
Unit division: 0

## 2021-12-14 ENCOUNTER — Other Ambulatory Visit: Payer: 59

## 2021-12-14 ENCOUNTER — Telehealth: Payer: Self-pay

## 2021-12-14 ENCOUNTER — Other Ambulatory Visit: Payer: Self-pay

## 2021-12-14 DIAGNOSIS — N96 Recurrent pregnancy loss: Secondary | ICD-10-CM

## 2021-12-14 DIAGNOSIS — O209 Hemorrhage in early pregnancy, unspecified: Secondary | ICD-10-CM | POA: Diagnosis not present

## 2021-12-14 DIAGNOSIS — O3680X0 Pregnancy with inconclusive fetal viability, not applicable or unspecified: Secondary | ICD-10-CM

## 2021-12-14 LAB — GC/CHLAMYDIA PROBE AMP (~~LOC~~) NOT AT ARMC
Chlamydia: NEGATIVE
Comment: NEGATIVE
Comment: NORMAL
Neisseria Gonorrhea: NEGATIVE

## 2021-12-14 LAB — BETA HCG QUANT (REF LAB): hCG Quant: 5927 m[IU]/mL

## 2021-12-14 NOTE — Progress Notes (Signed)
Patient sent to lab for stat hcg. Jamella Grayer RN 

## 2021-12-14 NOTE — Addendum Note (Signed)
Addended by: Anell Barr on: 12/14/2021 12:24 PM   Modules accepted: Orders

## 2021-12-14 NOTE — Telephone Encounter (Signed)
Patient called and made aware that Dr. Damita Dunnings reviewed her HCG performed today and it did rise appropiately. Dr. Damita Dunnings has placed order for patient to have ultrasound this week for viability.   Patient is to also keep her new ob appointment on January 13, 2022 with [place]. Patient states understanding. Kathrene Alu RN

## 2021-12-18 ENCOUNTER — Other Ambulatory Visit: Payer: Self-pay

## 2021-12-18 ENCOUNTER — Ambulatory Visit (HOSPITAL_BASED_OUTPATIENT_CLINIC_OR_DEPARTMENT_OTHER)
Admission: RE | Admit: 2021-12-18 | Discharge: 2021-12-18 | Disposition: A | Discharge status: Home / Self Care | Payer: 59 | Source: Ambulatory Visit | Attending: Obstetrics and Gynecology | Admitting: Obstetrics and Gynecology

## 2021-12-18 DIAGNOSIS — N96 Recurrent pregnancy loss: Secondary | ICD-10-CM | POA: Diagnosis not present

## 2021-12-18 DIAGNOSIS — O209 Hemorrhage in early pregnancy, unspecified: Secondary | ICD-10-CM | POA: Diagnosis not present

## 2021-12-18 DIAGNOSIS — Z3A01 Less than 8 weeks gestation of pregnancy: Secondary | ICD-10-CM | POA: Diagnosis not present

## 2021-12-18 DIAGNOSIS — N939 Abnormal uterine and vaginal bleeding, unspecified: Secondary | ICD-10-CM | POA: Diagnosis not present

## 2021-12-21 ENCOUNTER — Other Ambulatory Visit: Payer: Self-pay

## 2021-12-21 DIAGNOSIS — N96 Recurrent pregnancy loss: Secondary | ICD-10-CM

## 2021-12-21 DIAGNOSIS — O209 Hemorrhage in early pregnancy, unspecified: Secondary | ICD-10-CM

## 2021-12-21 DIAGNOSIS — O3680X0 Pregnancy with inconclusive fetal viability, not applicable or unspecified: Secondary | ICD-10-CM

## 2021-12-23 ENCOUNTER — Other Ambulatory Visit: Payer: Self-pay

## 2021-12-23 ENCOUNTER — Other Ambulatory Visit (INDEPENDENT_AMBULATORY_CARE_PROVIDER_SITE_OTHER): Payer: 59

## 2021-12-23 VITALS — BP 125/76 | HR 90 | Wt 192.0 lb

## 2021-12-23 DIAGNOSIS — O209 Hemorrhage in early pregnancy, unspecified: Secondary | ICD-10-CM | POA: Diagnosis not present

## 2021-12-23 NOTE — Progress Notes (Deleted)
Established Patient Office Visit  Subjective:  Patient ID: Denise [name], female    DOB: 1986/02/17  Age: 36 y.o. MRN: [id number]  CC: No chief complaint on file.   HPI JAMILET AMBROISE presents for ***  Past Medical History:  Diagnosis Date   Complication of anesthesia    Kidney stone    Malignant hyperthermia    paternal grandfather passed from [facility]   Migraine     Past Surgical History:  Procedure Laterality Date   CESAREAN SECTION N/A 12/29/2019   Procedure: CESAREAN SECTION;  Surgeon: Catalina Antigua, MD;  Location: [facility] LD ORS;  Service: Obstetrics;  Laterality: N/A;   KIDNEY STONE SURGERY  2014   L and R     Family History  Problem Relation Age of Onset   Endometrial cancer Mother    Breast cancer Maternal Grandmother    Skin cancer Maternal Grandmother    Lung cancer Maternal Grandmother    Stroke Maternal Grandfather    Endometrial cancer Sister    Breast cancer Maternal Aunt    Breast cancer Maternal Aunt    Breast cancer Maternal Aunt     Social History   Socioeconomic History   Marital status: Married    Spouse name: Not on file   Number of children: Not on file   Years of education: Not on file   Highest education level: Not on file  Occupational History   Not on file  Tobacco Use   Smoking status: Never   Smokeless tobacco: Never  Vaping Use   Vaping Use: Never used  Substance and Sexual Activity   Alcohol use: Not Currently    Comment: occ   Drug use: No   Sexual activity: Yes  Other Topics Concern   Not on file  Social History Narrative   Not on file   Social Determinants of Health   Financial Resource Strain: Not on file  Food Insecurity: Not on file  Transportation Needs: Not on file  Physical Activity: Not on file  Stress: Not on file  Social Connections: Not on file  Intimate Partner Violence: Not on file    Outpatient Medications Prior to Visit  Medication Sig Dispense Refill   Prenatal Vit-Fe Fumarate-FA (PRENATAL  VITAMINS PO) Take by mouth.     Prenatal Vit-Fe Fumarate-FA (PRENATAL MULTIVITAMIN) TABS tablet Take 1 tablet by mouth daily at 12 noon. (Patient not taking: Reported on 06/10/2020)     No facility-administered medications prior to visit.    No Known Allergies  ROS Review of Systems    Objective:    Physical Exam  BP 125/76    Pulse 90    Wt 192 lb (87.1 kg)    LMP 11/10/2021    BMI 32.96 kg/m  Wt Readings from Last 3 Encounters:  12/23/21 192 lb (87.1 kg)  12/11/21 194 lb 1.6 oz (88 kg)  06/11/21 205 lb (93 kg)     Health Maintenance Due  Topic Date Due   COVID-19 Vaccine (1) Never done   Hepatitis C Screening  Never done   INFLUENZA VACCINE  Never done    There are no preventive care reminders to display for this patient.  Lab Results  Component Value Date   TSH 1.950 04/07/2017   Lab Results  Component Value Date   WBC 7.7 12/11/2021   HGB 12.1 12/11/2021   HCT 37.5 12/11/2021   MCV 79.4 (L) 12/11/2021   PLT 223 12/11/2021   Lab Results  Component  Value Date   NA 135 12/29/2019   K 5.0 12/29/2019   CO2 17 (L) 12/29/2019   GLUCOSE 114 (H) 12/29/2019   BUN 8 12/29/2019   CREATININE 0.82 12/30/2019   BILITOT 0.5 12/29/2019   ALKPHOS 99 12/29/2019   AST 21 12/29/2019   ALT 16 12/29/2019   PROT 6.1 (L) 12/29/2019   ALBUMIN 2.8 (L) 12/29/2019   CALCIUM 8.1 (L) 12/29/2019   ANIONGAP 11 12/29/2019   No results found for: CHOL No results found for: HDL No results found for: LDLCALC No results found for: TRIG No results found for: CHOLHDL Lab Results  Component Value Date   HGBA1C 5.2 08/16/2019      Assessment & Plan:   Problem List Items Addressed This Visit   None Visit Diagnoses     Bleeding in early pregnancy    -  Primary   Relevant Orders   Beta hCG quant (ref lab)       No orders of the defined types were placed in this encounter.   Follow-up: No follow-ups on file.    Aija Scarfo l Nease, CMA

## 2021-12-23 NOTE — Progress Notes (Signed)
Pt presents for repeat Beta hCG.Pt was sent to the lab to have lab drawn. Pt states she is not having any bleeding or pain. Advised to go to [facility].  Denise [name] l Maris, CMA

## 2021-12-24 LAB — BETA HCG QUANT (REF LAB): hCG Quant: 33534 m[IU]/mL

## 2021-12-24 NOTE — Progress Notes (Signed)
Chart reviewed - agree with CMA/RN documentation.  ° °

## 2021-12-28 ENCOUNTER — Ambulatory Visit (HOSPITAL_BASED_OUTPATIENT_CLINIC_OR_DEPARTMENT_OTHER)
Admission: RE | Admit: 2021-12-28 | Discharge: 2021-12-28 | Disposition: A | Discharge status: Home / Self Care | Payer: 59 | Source: Ambulatory Visit | Attending: Obstetrics and Gynecology | Admitting: Obstetrics and Gynecology

## 2021-12-28 ENCOUNTER — Other Ambulatory Visit: Payer: Self-pay

## 2021-12-28 ENCOUNTER — Ambulatory Visit: Payer: 59 | Admitting: Obstetrics & Gynecology

## 2021-12-28 ENCOUNTER — Other Ambulatory Visit: Payer: Self-pay | Admitting: Obstetrics and Gynecology

## 2021-12-28 DIAGNOSIS — O209 Hemorrhage in early pregnancy, unspecified: Secondary | ICD-10-CM

## 2021-12-28 DIAGNOSIS — N96 Recurrent pregnancy loss: Secondary | ICD-10-CM | POA: Diagnosis not present

## 2021-12-28 DIAGNOSIS — O3680X0 Pregnancy with inconclusive fetal viability, not applicable or unspecified: Secondary | ICD-10-CM

## 2021-12-30 ENCOUNTER — Other Ambulatory Visit: Payer: Self-pay

## 2021-12-30 ENCOUNTER — Ambulatory Visit: Payer: 59 | Admitting: Family Medicine

## 2021-12-30 ENCOUNTER — Encounter: Payer: Self-pay | Admitting: Family Medicine

## 2021-12-30 VITALS — BP 119/80 | HR 96 | Ht 64.0 in | Wt 191.0 lb

## 2021-12-30 DIAGNOSIS — Z3A01 Less than 8 weeks gestation of pregnancy: Secondary | ICD-10-CM | POA: Diagnosis not present

## 2021-12-30 DIAGNOSIS — O418X11 Other specified disorders of amniotic fluid and membranes, first trimester, fetus 1: Secondary | ICD-10-CM | POA: Diagnosis not present

## 2021-12-30 DIAGNOSIS — O468X1 Other antepartum hemorrhage, first trimester: Secondary | ICD-10-CM

## 2021-12-30 DIAGNOSIS — O418X10 Other specified disorders of amniotic fluid and membranes, first trimester, not applicable or unspecified: Secondary | ICD-10-CM

## 2021-12-30 DIAGNOSIS — O219 Vomiting of pregnancy, unspecified: Secondary | ICD-10-CM

## 2021-12-30 NOTE — Progress Notes (Signed)
° °  Subjective:    Patient ID: Denise [name], female    DOB: 1986/01/20, 36 y.o.   MRN: [id number]  HPI  Seen for ultrasound results.  She had a early pregnancy with uncertain viability.  Her last ultrasound was on 2/13 which showed a IUP with a fetus with cardiac activity of 192.  She had a small subchorionic hematoma.  She reports spotting.  She is nauseated, but not wanting medications at this point.  Review of Systems     Objective:   Physical Exam Vitals and nursing note reviewed.  Constitutional:      Appearance: Normal appearance.  Skin:    Capillary Refill: Capillary refill takes less than 2 seconds.  Neurological:     General: No focal deficit present.     Mental Status: She is alert.  Psychiatric:        Mood and Affect: Mood normal.        Behavior: Behavior normal.        Thought Content: Thought content normal.        Judgment: Judgment normal.      Assessment & Plan:  1. Subchorionic hemorrhage of placenta in first trimester, single or unspecified fetus Discussed [place] results. Discussed warning signs, things to watch for.  2. Nausea and vomiting in pregnancy We discussed nonpharmacological ways to control nausea.  Patient will reach out if desires medications.  Would recommend Diclegis as first agent.

## 2021-12-30 NOTE — Progress Notes (Signed)
Patient following up from HCG and Ultrasound on 12-28-21. Armandina Stammer RN

## 2022-01-13 ENCOUNTER — Encounter: Payer: Self-pay | Admitting: Family Medicine

## 2022-01-13 ENCOUNTER — Other Ambulatory Visit: Payer: Self-pay

## 2022-01-13 ENCOUNTER — Ambulatory Visit (INDEPENDENT_AMBULATORY_CARE_PROVIDER_SITE_OTHER): Payer: 59 | Admitting: Family Medicine

## 2022-01-13 VITALS — BP 124/84 | HR 84 | Wt 193.0 lb

## 2022-01-13 DIAGNOSIS — Z3A09 9 weeks gestation of pregnancy: Secondary | ICD-10-CM

## 2022-01-13 DIAGNOSIS — O021 Missed abortion: Secondary | ICD-10-CM

## 2022-01-13 DIAGNOSIS — O09899 Supervision of other high risk pregnancies, unspecified trimester: Secondary | ICD-10-CM | POA: Insufficient documentation

## 2022-01-13 DIAGNOSIS — Z1589 Genetic susceptibility to other disease: Secondary | ICD-10-CM

## 2022-01-13 MED ORDER — OXYCODONE-ACETAMINOPHEN 5-325 MG PO TABS: 1.0000 | ORAL_TABLET | Freq: Four times a day (QID) | ORAL | 0 refills | Status: DC | PRN

## 2022-01-13 MED ORDER — MISOPROSTOL 200 MCG PO TABS: ORAL_TABLET | ORAL | 1 refills | Status: DC

## 2022-01-13 NOTE — Progress Notes (Signed)
DATING AND VIABILITY SONOGRAM ? ? ?Denise [name] is a 36 y.o. year old [phone] with LMP Patient's last menstrual period was 11/10/2021. which would correlate to  [redacted]w[redacted]d weeks gestation.  She has regular menstrual cycles.   She is here today for a confirmatory initial sonogram. ? ? ? ?GESTATION: ?SINGLETON    ? ?FETAL ACTIVITY: ?         Heart rate         none ?         The fetus is inactive. ? ? ?Gestational BVA:POLIDCV irregular shaped.  ? ? ?ADNEXA: ?The ovaries are normal. ? ? ?GESTATIONAL AGE AND  BIOMETRICS: ? ?Gestational criteria: Estimated Date of Delivery: 08/17/22 by LMP now at [redacted]w[redacted]d ? ?Previous Scans:2 ? ?GESTATIONAL SAC          Irreg shape      ?CROWN RUMP LENGTH          1.14 cm         7-2 weeks  ?    ?    ?    ?    ?    ?    ?    ? ?                                                AVERAGE EGA(BY THIS SCAN): no heart rate detected. Color doppler applied and no flow seen.  ?Patient states her nausea stopped 3 -4 days ago.  ? ? ?  ? ? ?TECHNICIAN COMMENTS: ?Patient informed that the ultrasound is considered a limited obstetric ultrasound and is not intended to be a complete ultrasound exam.  Patient also informed that the ultrasound is not being completed with the intent of assessing for fetal or placental anomalies or any pelvic abnormalities. Explained that the purpose of today's ultrasound is to assess for fetal heart rate.  Patient acknowledges the purpose of the exam and the limitations of the study.    ? ? ?Armandina Stammer ?01/13/2022 ?9:51 AM ? ? ?

## 2022-01-13 NOTE — Progress Notes (Signed)
? ?  Subjective:  ? ? Patient ID: Denise [name], female    DOB: Nov 12, 1986, 36 y.o.   MRN: [id number] ? ?HPI ?Patient here for initial OB appointment.  Ultrasound today shows no cardiac activity.  Her nausea and vomiting have improved.  Additionally, her spotting has also increased a bit, and then improved again. ? 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 ? ?Review of Systems ? ?   ?Objective:  ? Physical Exam ?Vitals reviewed.  ?Constitutional:   ?   Appearance: Normal appearance.  ?Neurological:  ?   Mental Status: She is alert.  ? ? ? ?   ?Assessment & Plan:  ?1. Missed ab ?Discussed options: Medical versus surgical.  Patient opting for medical management with Cytotec.  Discussed how to take, things to watch for.  We will also prescribe Percocet to help with pain control during the process.  Follow-up in 2 weeks ? ?2. PAI-1 4G/4G genotype ?Patient should be a prophylactic Lovenox with subsequent pregnancy at the time of positive pregnancy test ? ? ? ?

## 2022-01-15 ENCOUNTER — Telehealth: Payer: Self-pay

## 2022-01-15 MED ORDER — OXYCODONE-ACETAMINOPHEN 5-325 MG PO TABS: 1.0000 | ORAL_TABLET | Freq: Four times a day (QID) | ORAL | 0 refills | Status: DC | PRN

## 2022-01-15 NOTE — Telephone Encounter (Signed)
Spoke with patient's husband. Notified of refill on cytotec, although probably has passed everything. Even still, low risk to take cytotec again. Refilled percocet. ? ?

## 2022-01-15 NOTE — Telephone Encounter (Signed)
-----   Message from Alvin Critchley sent at 01/15/2022  9:00 AM EST ----- ?Regarding: SAB ?Patient husband called stating that the patient passed a small greyish tissue this morning the size of a fingertip. She still feels there is more left inside. Dr. Adrian Blackwater instructed them to call for another dose of the medication given on 01/13/22 if needed. Husband is requesting medication and call back.  ? ?

## 2022-01-18 ENCOUNTER — Encounter: Payer: Self-pay | Admitting: Family Medicine

## 2022-01-18 ENCOUNTER — Inpatient Hospital Stay (HOSPITAL_COMMUNITY)
Admission: AD | Admit: 2022-01-18 | Discharge: 2022-01-18 | Disposition: A | Discharge status: Home / Self Care | Payer: 59 | Attending: Family Medicine | Admitting: Family Medicine

## 2022-01-18 ENCOUNTER — Inpatient Hospital Stay (HOSPITAL_COMMUNITY): Payer: 59

## 2022-01-18 ENCOUNTER — Other Ambulatory Visit: Payer: Self-pay

## 2022-01-18 ENCOUNTER — Encounter (HOSPITAL_COMMUNITY): Payer: Self-pay | Admitting: Family Medicine

## 2022-01-18 DIAGNOSIS — O021 Missed abortion: Secondary | ICD-10-CM | POA: Diagnosis not present

## 2022-01-18 DIAGNOSIS — O034 Incomplete spontaneous abortion without complication: Secondary | ICD-10-CM | POA: Diagnosis not present

## 2022-01-18 DIAGNOSIS — N8500 Endometrial hyperplasia, unspecified: Secondary | ICD-10-CM | POA: Diagnosis not present

## 2022-01-18 DIAGNOSIS — O039 Complete or unspecified spontaneous abortion without complication: Secondary | ICD-10-CM | POA: Diagnosis not present

## 2022-01-18 DIAGNOSIS — R9389 Abnormal findings on diagnostic imaging of other specified body structures: Secondary | ICD-10-CM | POA: Diagnosis not present

## 2022-01-18 LAB — URINALYSIS, ROUTINE W REFLEX MICROSCOPIC
Bacteria, UA: NONE SEEN
Bilirubin Urine: NEGATIVE
Glucose, UA: NEGATIVE mg/dL
Ketones, ur: NEGATIVE mg/dL
Leukocytes,Ua: NEGATIVE
Nitrite: NEGATIVE
Protein, ur: NEGATIVE mg/dL
RBC / HPF: >50 RBC/hpf — ABNORMAL HIGH (ref 0–5)
Specific Gravity, Urine: 1.02 (ref 1.005–1.030)
pH: 7 (ref 5.0–8.0)

## 2022-01-18 LAB — TYPE AND SCREEN
ABO/RH(D): A NEG
Antibody Screen: POSITIVE

## 2022-01-18 LAB — CBC
HCT: 35.7 % — ABNORMAL LOW (ref 36.0–46.0)
Hemoglobin: 11.9 g/dL — ABNORMAL LOW (ref 12.0–15.0)
MCH: 27 pg (ref 26.0–34.0)
MCHC: 33.3 g/dL (ref 30.0–36.0)
MCV: 81 fL (ref 80.0–100.0)
Platelets: 233 10*3/uL (ref 150–400)
RBC: 4.41 MIL/uL (ref 3.87–5.11)
RDW: 16.4 % — ABNORMAL HIGH (ref 11.5–15.5)
WBC: 7.9 10*3/uL (ref 4.0–10.5)
nRBC: 0 % (ref 0.0–0.2)

## 2022-01-18 LAB — HCG, QUANTITATIVE, PREGNANCY: hCG, Beta Chain, Quant, S: 3417 m[IU]/mL — ABNORMAL HIGH (ref <5)

## 2022-01-18 MED ORDER — HYDROMORPHONE HCL 1 MG/ML IJ SOLN
0.5000 mg | Freq: Once | INTRAMUSCULAR | Status: AC
  Administered 2022-01-18: 0.5 mg via INTRAVENOUS
  Filled 2022-01-18: qty 1

## 2022-01-18 MED ORDER — LACTATED RINGERS IV BOLUS
1000.0000 mL | Freq: Once | INTRAVENOUS | Status: AC
  Administered 2022-01-18: 1000 mL via INTRAVENOUS

## 2022-01-18 MED ORDER — OXYCODONE-ACETAMINOPHEN 5-325 MG PO TABS: 1.0000 | ORAL_TABLET | ORAL | 0 refills | Status: DC | PRN

## 2022-01-18 MED ORDER — ONDANSETRON HCL 4 MG/2ML IJ SOLN
4.0000 mg | Freq: Once | INTRAMUSCULAR | Status: AC
  Administered 2022-01-18: 4 mg via INTRAVENOUS
  Filled 2022-01-18: qty 2

## 2022-01-18 NOTE — MAU Provider Note (Signed)
?History  ?  ? ?CSN: [id number] ? ?Arrival date and time: 01/18/22 1433 ? ? ? ?Chief Complaint  ?Patient presents with  ? Vaginal Bleeding  ? ?HPI ?This is a 36 year old G6 P0-1-4-1 who was diagnosed with a missed AB on 3/1.  She was counseled and given a prescription for Cytotec.  Patient took the medication on 3/2 and passed some small tissue on 3/3.  She took another dose of the medication on 3/3 and since that time has been passing pieces of tissue that are approximately 4 inches x 1 inch.  She is having a lot of pain and a lot of cramping.  Passing small amounts of clots.  She contacted me today regarding the pain and tissue that she has been passing and I directed her to the emergency department for evaluation. ? ?OB History   ? ? Gravida  ?6  ? Para  ?1  ? Term  ?   ? Preterm  ?1  ? AB  ?4  ? Living  ?1  ?  ? ? SAB  ?3  ? IAB  ?   ? Ectopic  ?1  ? Multiple  ?0  ? Live Births  ?1  ?   ?  ? Obstetric Comments  ?One adopted daughter   ?  ? ?  ? ? ?Past Medical History:  ?Diagnosis Date  ? Complication of anesthesia   ? Kidney stone   ? Malignant hyperthermia   ? paternal grandfather passed from [facility]  ? Migraine   ? ? ?Past Surgical History:  ?Procedure Laterality Date  ? CESAREAN SECTION N/A 12/29/2019  ? Procedure: CESAREAN SECTION;  Surgeon: Catalina Antigua, MD;  Location: [facility] LD [facility];  Service: Obstetrics;  Laterality: N/A;  ? KIDNEY STONE SURGERY  2014  ? L and R   ? ? ?Family History  ?Problem Relation Age of Onset  ? Endometrial cancer Mother   ? Endometrial cancer Sister   ? Stroke Brother   ? Breast cancer Maternal Aunt   ? Breast cancer Maternal Aunt   ? Breast cancer Maternal Aunt   ? Breast cancer Maternal Grandmother   ? Skin cancer Maternal Grandmother   ? Lung cancer Maternal Grandmother   ? Stroke Maternal Grandfather   ? ? ?Social History  ? ?Tobacco Use  ? Smoking status: Never  ? Smokeless tobacco: Never  ?Vaping Use  ? Vaping Use: Never used  ?Substance Use Topics  ? Alcohol use: Not Currently  ?   Comment: occ  ? Drug use: No  ? ? ?Allergies: No Known Allergies ? ?Medications Prior to Admission  ?Medication Sig Dispense Refill Last Dose  ? ibuprofen (ADVIL) 800 MG tablet Take 800 mg by mouth every 8 (eight) hours as needed.   01/18/2022 at 1200  ? misoprostol (CYTOTEC) 200 MCG tablet Place four tablets in between your gums and cheeks (two tablets on each side) as instructed 4 tablet 1 Past Week  ? oxyCODONE-acetaminophen (PERCOCET/ROXICET) 5-325 MG tablet Take 1-2 tablets by mouth every 6 (six) hours as needed. 15 tablet 0 01/18/2022 at 1000  ? Prenatal Vit-Fe Fumarate-FA (PRENATAL MULTIVITAMIN) TABS tablet Take 1 tablet by mouth daily at 12 noon.     ? Prenatal Vit-Fe Fumarate-FA (PRENATAL VITAMINS PO) Take by mouth. (Patient not taking: Reported on 01/13/2022)     ? ? ?Review of Systems ?Physical Exam  ? ?Blood pressure 140/88, pulse 84, temperature 98.9 ?F (37.2 ?C), temperature source Oral, resp. rate 18, height 5'  4" (1.626 m), weight 90.9 kg, last menstrual period 11/10/2021, SpO2 100 %, unknown if currently breastfeeding. ? ?Physical Exam ?Vitals reviewed.  ?Constitutional:   ?   Appearance: Normal appearance.  ?Cardiovascular:  ?   Rate and Rhythm: Normal rate and regular rhythm.  ?Pulmonary:  ?   Effort: Pulmonary effort is normal.  ?   Breath sounds: Normal breath sounds.  ?Abdominal:  ?   General: Abdomen is flat.  ?   Palpations: Abdomen is soft.  ?   Tenderness: There is abdominal tenderness. There is no right CVA tenderness or left CVA tenderness.  ?Neurological:  ?   Mental Status: She is alert.  ?Psychiatric:     ?   Mood and Affect: Mood normal.     ?   Behavior: Behavior normal.     ?   Thought Content: Thought content normal.     ?   Judgment: Judgment normal.  ? ?Results for orders placed or performed during the hospital encounter of 01/18/22 (from the past 24 hour(s))  ?CBC     Status: Abnormal  ? Collection Time: 01/18/22  2:54 PM  ?Result Value Ref Range  ? WBC 7.9 4.0 - 10.5 K/uL  ? RBC 4.41  3.87 - 5.11 MIL/uL  ? Hemoglobin 11.9 (L) 12.0 - 15.0 g/dL  ? HCT 35.7 (L) 36.0 - 46.0 %  ? MCV 81.0 80.0 - 100.0 fL  ? MCH 27.0 26.0 - 34.0 pg  ? MCHC 33.3 30.0 - 36.0 g/dL  ? RDW 16.4 (H) 11.5 - 15.5 %  ? Platelets 233 150 - 400 K/uL  ? nRBC 0.0 0.0 - 0.2 %  ?hCG, quantitative, pregnancy     Status: Abnormal  ? Collection Time: 01/18/22  2:54 PM  ?Result Value Ref Range  ? hCG, Beta Chain, Quant, S 3,417 (H) <5 mIU/mL  ?Type and screen [facility]     Status: None  ? Collection Time: 01/18/22  2:54 PM  ?Result Value Ref Range  ? ABO/RH(D) A NEG   ? Antibody Screen POS   ? Sample Expiration 01/21/2022,2359   ? Antibody Identification    ?  PASSIVELY ACQUIRED ANTI-D ?Performed at [facility], [address], [place], [place] [address] ?  ?Urinalysis, Routine w reflex microscopic Urine, Clean Catch     Status: Abnormal  ? Collection Time: 01/18/22  3:11 PM  ?Result Value Ref Range  ? Color, Urine YELLOW YELLOW  ? APPearance CLEAR CLEAR  ? Specific Gravity, Urine 1.020 1.005 - 1.030  ? pH 7.0 5.0 - 8.0  ? Glucose, UA NEGATIVE NEGATIVE mg/dL  ? Hgb urine dipstick MODERATE (A) NEGATIVE  ? Bilirubin Urine NEGATIVE NEGATIVE  ? Ketones, ur NEGATIVE NEGATIVE mg/dL  ? Protein, ur NEGATIVE NEGATIVE mg/dL  ? Nitrite NEGATIVE NEGATIVE  ? Leukocytes,Ua NEGATIVE NEGATIVE  ? RBC / HPF >50 (H) 0 - 5 RBC/hpf  ? WBC, UA 0-5 0 - 5 WBC/hpf  ? Bacteria, UA NONE SEEN NONE SEEN  ? Squamous Epithelial / LPF 0-5 0 - 5  ? Mucus PRESENT   ? ?US OB Transvaginal ? ?Result Date: 01/18/2022 ?CLINICAL DATA:  Medical abortion, bleeding. EXAM: OBSTETRIC <14 WK ULTRASOUND TECHNIQUE: Transvaginal ultrasound was performed for evaluation of the gestation as well as the maternal uterus and adnexal regions. COMPARISON:  Obstetrical ultrasound 12/28/2021. FINDINGS: Intrauterine gestational sac: None Endometrium: Heterogeneous and mildly thickened measuring up to 2.2 cm. No vascularity identified in the endometrium. Maternal  uterus/adnexae:  Ovaries not visualized.  No free fluid. IMPRESSION: 1. No intrauterine gestational sac identified. Heterogeneous thickened endometrium without vascularity. Findings may be related to hemorrhagic products. Recommend follow-up with serial beta HCG. Short-term follow-up ultrasound can be performed to confirm complete resolution. Electronically Signed   By: Darliss Cheney M.D.   On: 01/18/2022 16:15   ? ? ?MAU Course  ?Procedures ? ?MDM ? ? ?Assessment and Plan  ? ?1. Incomplete abortion   ?2. Retained products of conception after miscarriage   ? ?Not actively bleeding, but has retained products.  ?S/p cytotec x2.  ?Surgery scheduled for Wednesday. ?Will cover with pain medicine until surgery. Patient to return sooner if starts to have heavier bleeding. ? ? ?Levie Heritage ?01/18/2022, 3:16 PM  ?

## 2022-01-18 NOTE — Telephone Encounter (Signed)
Called patient, recommended coming to the hospital for [place] and evaluation ?

## 2022-01-18 NOTE — MAU Note (Signed)
Denise [name] is a 36 y.o. at [redacted]w[redacted]d here in MAU reporting: went to appointment last week and found out that baby did not have heartbeat. Took first dose of cytotech on Thursday and the second dose on Friday. States her pain has become unmanageable.. she will start having bad cramps that feels like "tearing" and will pass baseball size clots.   ? ?Pain score: 10 ?Vitals:  ? 01/18/22 1507  ?BP: 140/88  ?Pulse: 84  ?Resp: 18  ?Temp: 98.9 ?F (37.2 ?C)  ?SpO2: 100%  ?   ?Lab orders placed from triage: UA  ? ?

## 2022-01-19 ENCOUNTER — Encounter (HOSPITAL_BASED_OUTPATIENT_CLINIC_OR_DEPARTMENT_OTHER): Payer: Self-pay | Admitting: Obstetrics & Gynecology

## 2022-01-19 ENCOUNTER — Other Ambulatory Visit: Payer: Self-pay | Admitting: Obstetrics & Gynecology

## 2022-01-19 ENCOUNTER — Telehealth: Payer: Self-pay

## 2022-01-19 DIAGNOSIS — O034 Incomplete spontaneous abortion without complication: Secondary | ICD-10-CM

## 2022-01-19 NOTE — Telephone Encounter (Signed)
Called patient to let her know surgery location had changed, no answer, left voicemail letting her know the new location and call back number if she had any questions ?

## 2022-01-19 NOTE — Progress Notes (Signed)
Pre op call completed, pt to come in today for repeat cbc. Per husband pt has received rhogam in MAU and will bring the card with the information on it with her.  ?Family hx of MH reviewed with Dr Stephannie Peters and Dr Mal Amabile. Ok for surgery to be done at [facility], no anesthesia consult needed.  ?

## 2022-01-19 NOTE — H&P (Signed)
Preoperative History and Physical  Denise [name] is a 36 y.o. [phone] here for surgical management of retained products of conception after miscarriage; was treated with misoprostol x 2 for missed abortion in the first trimester.   No significant preoperative concerns.  Proposed surgery: Dilation and Evacuation  Past Medical History:  Diagnosis Date   Family history of malignant hyperthermia    Paternal grandfather passed from [facility]   Kidney stone    Migraine    Past Surgical History:  Procedure Laterality Date   CESAREAN SECTION N/A 12/29/2019   Procedure: CESAREAN SECTION;  Surgeon: Catalina Antigua, MD;  Location: [facility] LD ORS;  Service: Obstetrics;  Laterality: N/A;   KIDNEY STONE SURGERY  2014   L and R    OB History  Gravida Para Term Preterm AB Living  6 1   1 4 1   SAB IAB Ectopic Multiple Live Births  3   1 0 1    # Outcome Date GA Lbr Len/2nd Weight Sex Delivery Anes PTL Lv  6 Current           5 Preterm 12/29/19 [redacted]w[redacted]d  1770 g M CS-LTranv Gen  LIV  4 SAB 2017 [redacted]w[redacted]d         3 SAB 2017 [redacted]w[redacted]d         2 Ectopic 2017 [redacted]w[redacted]d         1 SAB 2017 [redacted]w[redacted]d           Obstetric Comments  One adopted daughter   Patient denies any other pertinent gynecologic issues.   No current facility-administered medications on file prior to encounter.   Current Outpatient Medications on File Prior to Encounter  Medication Sig Dispense Refill   ibuprofen (ADVIL) 800 MG tablet Take 800 mg by mouth every 8 (eight) hours as needed.     oxyCODONE-acetaminophen (PERCOCET/ROXICET) 5-325 MG tablet Take 1 tablet by mouth every 4 (four) hours as needed. 40 tablet 0   Prenatal Vit-Fe Fumarate-FA (PRENATAL MULTIVITAMIN) TABS tablet Take 1 tablet by mouth daily at 12 noon.     No Known Allergies  Social History:   reports that she has never smoked. She has never used smokeless tobacco. She reports that she does not currently use alcohol. She reports that she does not use drugs.  Family History  Problem  Relation Age of Onset   Endometrial cancer Mother    Endometrial cancer Sister    Stroke Brother    Breast cancer Maternal Aunt    Breast cancer Maternal Aunt    Breast cancer Maternal Aunt    Breast cancer Maternal Grandmother    Skin cancer Maternal Grandmother    Lung cancer Maternal Grandmother    Stroke Maternal Grandfather     Review of Systems: Pertinent items noted in HPI and remainder of comprehensive ROS otherwise negative.  PHYSICAL EXAM: Blood pressure 126/83, pulse 72, temperature 97.7 F (36.5 C), temperature source Oral, resp. rate 18, height 5\' 4"  (1.626 m), weight 90.1 kg, last menstrual period 11/10/2021, SpO2 100 %, unknown if currently breastfeeding. CONSTITUTIONAL: Well-developed, well-nourished female in no acute distress.  HENT:  Normocephalic, atraumatic, External right and left ear normal. Oropharynx is clear and moist EYES: Conjunctivae and EOM are normal. Pupils are equal, round, and reactive to light. No scleral icterus.  NECK: Normal range of motion, supple, no masses SKIN: Skin is warm and dry. No rash noted. Not diaphoretic. No erythema. No pallor. NEUROLOGIC: Alert and oriented to person, place, and  time. Normal reflexes, muscle tone coordination. No cranial nerve deficit noted. PSYCHIATRIC: Normal mood and affect. Normal behavior. Normal judgment and thought content. CARDIOVASCULAR: Normal heart rate noted, regular rhythm RESPIRATORY: Effort and breath sounds normal, no problems with respiration noted ABDOMEN: Soft, nontender, nondistended. PELVIC: Deferred MUSCULOSKELETAL: Normal range of motion. No edema and no tenderness. 2+ distal pulses.  Labs: Results for orders placed or performed during the hospital encounter of 01/20/22 (from the past 336 hour(s))  CBC   Collection Time: 01/20/22  9:50 AM  Result Value Ref Range   WBC 6.9 4.0 - 10.5 K/uL   RBC 4.74 3.87 - 5.11 MIL/uL   Hemoglobin 12.3 12.0 - 15.0 g/dL   HCT 46.2 86.3 - 81.7 %   MCV  82.5 80.0 - 100.0 fL   MCH 25.9 (L) 26.0 - 34.0 pg   MCHC 31.5 30.0 - 36.0 g/dL   RDW 71.1 (H) 65.7 - 90.3 %   Platelets 201 150 - 400 K/uL   nRBC 0.0 0.0 - 0.2 %  Results for orders placed or performed during the hospital encounter of 01/18/22 (from the past 336 hour(s))  CBC   Collection Time: 01/18/22  2:54 PM  Result Value Ref Range   WBC 7.9 4.0 - 10.5 K/uL   RBC 4.41 3.87 - 5.11 MIL/uL   Hemoglobin 11.9 (L) 12.0 - 15.0 g/dL   HCT 83.3 (L) 38.3 - 29.1 %   MCV 81.0 80.0 - 100.0 fL   MCH 27.0 26.0 - 34.0 pg   MCHC 33.3 30.0 - 36.0 g/dL   RDW 91.6 (H) 60.6 - 00.4 %   Platelets 233 150 - 400 K/uL   nRBC 0.0 0.0 - 0.2 %  hCG, quantitative, pregnancy   Collection Time: 01/18/22  2:54 PM  Result Value Ref Range   hCG, Beta Chain, Quant, S 3,417 (H) <5 mIU/mL  Type and screen [facility]   Collection Time: 01/18/22  2:54 PM  Result Value Ref Range   ABO/RH(D) A NEG    Antibody Screen POS    Sample Expiration 01/21/2022,2359    Antibody Identification      PASSIVELY ACQUIRED ANTI-D Performed at [facility], [address], [place], [place] [address]   Urinalysis, Routine w reflex microscopic Urine, Clean Catch   Collection Time: 01/18/22  3:11 PM  Result Value Ref Range   Color, Urine YELLOW YELLOW   APPearance CLEAR CLEAR   Specific Gravity, Urine 1.020 1.005 - 1.030   pH 7.0 5.0 - 8.0   Glucose, UA NEGATIVE NEGATIVE mg/dL   Hgb urine dipstick MODERATE (A) NEGATIVE   Bilirubin Urine NEGATIVE NEGATIVE   Ketones, ur NEGATIVE NEGATIVE mg/dL   Protein, ur NEGATIVE NEGATIVE mg/dL   Nitrite NEGATIVE NEGATIVE   Leukocytes,Ua NEGATIVE NEGATIVE   RBC / HPF >50 (H) 0 - 5 RBC/hpf   WBC, UA 0-5 0 - 5 WBC/hpf   Bacteria, UA NONE SEEN NONE SEEN   Squamous Epithelial / LPF 0-5 0 - 5   Mucus PRESENT     Imaging Studies: US OB Transvaginal  Result Date: 01/18/2022 CLINICAL DATA:  Medical abortion, bleeding. EXAM: OBSTETRIC <14 WK ULTRASOUND TECHNIQUE:  Transvaginal ultrasound was performed for evaluation of the gestation as well as the maternal uterus and adnexal regions. COMPARISON:  Obstetrical ultrasound 12/28/2021. FINDINGS: Intrauterine gestational sac: None Endometrium: Heterogeneous and mildly thickened measuring up to 2.2 cm. No vascularity identified in the endometrium. Maternal uterus/adnexae: Ovaries not visualized.  No free  fluid. IMPRESSION: 1. No intrauterine gestational sac identified. Heterogeneous thickened endometrium without vascularity. Findings may be related to hemorrhagic products. Recommend follow-up with serial beta HCG. Short-term follow-up ultrasound can be performed to confirm complete resolution. Electronically Signed   By: Darliss Cheney M.D.   On: 01/18/2022 16:15   US OB LESS THAN 14 WEEKS WITH OB TRANSVAGINAL  Result Date: 12/28/2021 CLINICAL DATA:  First trimester pregnancy, assessment of viability, LMP 11/10/2021 EXAM: OBSTETRIC <14 WK [place] AND TRANSVAGINAL OB US TECHNIQUE: Both transabdominal and transvaginal ultrasound examinations were performed for complete evaluation of the gestation as well as the maternal uterus, adnexal regions, and pelvic cul-de-sac. Transvaginal technique was performed to assess early pregnancy. COMPARISON:  12/18/2021 FINDINGS: Intrauterine gestational sac: Present, single Yolk sac:  Present Embryo:  Present Cardiac Activity: Present Heart Rate: 192 bpm CRL:  8.1 mm   6 w   5 d                  [place] EDC: 08/18/2022 Subchorionic hemorrhage:  Small to moderate subchronic hemorrhage Maternal uterus/adnexae: Ovaries unremarkable. Small intramural leiomyoma LEFT lateral uterus 1.7 x 2.2 x 1.7 cm. No free pelvic fluid or adnexal masses. IMPRESSION: Single live intrauterine gestation at 6 weeks 5 days EGA. Small to moderate subchronic hemorrhage. Intramural leiomyoma 2.2 cm diameter LEFT lateral uterus. Electronically Signed   By: Ulyses Southward M.D.   On: 12/28/2021 09:48    Assessment: Principal Problem:    Retained products of conception after miscarriage    Plan: Patient will undergo surgical management with Dilation and Evacuation.  Risks of surgery including bleeding, infection, injury to surrounding organs, need for additional procedures, possibility of intrauterine scarring which may impair future fertility, risk of retained products which may require further management and other postoperative/anesthesia complications were explained to patient.  Likelihood of success of complete evacuation of the uterus was discussed with the patient.  Routine postoperative instructions will be reviewed with the patient and her family in detail after surgery.  The patient concurred with the proposed plan, giving informed written consent for the surgery.  Patient has been NPO since last night and she will remain NPO for procedure.  Anesthesia and OR aware.  Preoperative prophylactic Doxycycline 200mg  IV  has been ordered and is on call to the OR.  To OR when ready.    , MD, FACOG Obstetrician & Gynecologist, [facility] for [facility]

## 2022-01-20 ENCOUNTER — Ambulatory Visit (HOSPITAL_BASED_OUTPATIENT_CLINIC_OR_DEPARTMENT_OTHER): Payer: 59 | Admitting: Anesthesiology

## 2022-01-20 ENCOUNTER — Encounter (HOSPITAL_COMMUNITY)
Admission: AD | Disposition: A | Discharge status: Home / Self Care | Payer: Self-pay | Source: Home / Self Care | Attending: Family Medicine

## 2022-01-20 ENCOUNTER — Encounter (HOSPITAL_BASED_OUTPATIENT_CLINIC_OR_DEPARTMENT_OTHER): Payer: Self-pay | Admitting: Obstetrics & Gynecology

## 2022-01-20 ENCOUNTER — Encounter (HOSPITAL_BASED_OUTPATIENT_CLINIC_OR_DEPARTMENT_OTHER)
Admission: RE | Disposition: A | Discharge status: Home / Self Care | Payer: Self-pay | Source: Home / Self Care | Attending: Obstetrics & Gynecology

## 2022-01-20 ENCOUNTER — Other Ambulatory Visit: Payer: Self-pay

## 2022-01-20 ENCOUNTER — Ambulatory Visit (HOSPITAL_BASED_OUTPATIENT_CLINIC_OR_DEPARTMENT_OTHER)
Admission: RE | Admit: 2022-01-20 | Discharge: 2022-01-20 | Disposition: A | Discharge status: Home / Self Care | Payer: 59 | Attending: Obstetrics & Gynecology | Admitting: Obstetrics & Gynecology

## 2022-01-20 DIAGNOSIS — E669 Obesity, unspecified: Secondary | ICD-10-CM | POA: Diagnosis not present

## 2022-01-20 DIAGNOSIS — O034 Incomplete spontaneous abortion without complication: Secondary | ICD-10-CM | POA: Diagnosis not present

## 2022-01-20 DIAGNOSIS — Z6834 Body mass index (BMI) 34.0-34.9, adult: Secondary | ICD-10-CM | POA: Insufficient documentation

## 2022-01-20 DIAGNOSIS — O021 Missed abortion: Secondary | ICD-10-CM | POA: Diagnosis not present

## 2022-01-20 HISTORY — PX: DILATION AND EVACUATION: SHX1459

## 2022-01-20 HISTORY — DX: Family history of other specified conditions: Z84.89

## 2022-01-20 LAB — CBC
HCT: 39.1 % (ref 36.0–46.0)
Hemoglobin: 12.3 g/dL (ref 12.0–15.0)
MCH: 25.9 pg — ABNORMAL LOW (ref 26.0–34.0)
MCHC: 31.5 g/dL (ref 30.0–36.0)
MCV: 82.5 fL (ref 80.0–100.0)
Platelets: 201 10*3/uL (ref 150–400)
RBC: 4.74 MIL/uL (ref 3.87–5.11)
RDW: 16.5 % — ABNORMAL HIGH (ref 11.5–15.5)
WBC: 6.9 10*3/uL (ref 4.0–10.5)
nRBC: 0 % (ref 0.0–0.2)

## 2022-01-20 SURGERY — DILATION AND EVACUATION, UTERUS
Anesthesia: General | Site: Vagina

## 2022-01-20 SURGERY — DILATION AND EVACUATION, UTERUS
Anesthesia: Choice

## 2022-01-20 MED ORDER — DOXYCYCLINE HYCLATE 100 MG IV SOLR
200.0000 mg | INTRAVENOUS | Status: AC
  Administered 2022-01-20: 200 mg via INTRAVENOUS
  Filled 2022-01-20: qty 200

## 2022-01-20 MED ORDER — KETOROLAC TROMETHAMINE 30 MG/ML IJ SOLN
INTRAMUSCULAR | Status: AC
  Filled 2022-01-20: qty 1

## 2022-01-20 MED ORDER — BUPIVACAINE HCL 0.5 % IJ SOLN
INTRAMUSCULAR | Status: DC | PRN
  Administered 2022-01-20: 30 mL

## 2022-01-20 MED ORDER — FENTANYL CITRATE (PF) 100 MCG/2ML IJ SOLN
INTRAMUSCULAR | Status: DC | PRN
Start: 2022-01-20 — End: 2022-01-20
  Administered 2022-01-20: 50 ug via INTRAVENOUS

## 2022-01-20 MED ORDER — ACETAMINOPHEN 10 MG/ML IV SOLN: 1000.0000 mg | Freq: Once | INTRAVENOUS | Status: DC | PRN

## 2022-01-20 MED ORDER — MISOPROSTOL 200 MCG PO TABS
ORAL_TABLET | ORAL | Status: AC
  Filled 2022-01-20: qty 1

## 2022-01-20 MED ORDER — FENTANYL CITRATE (PF) 100 MCG/2ML IJ SOLN
INTRAMUSCULAR | Status: AC
  Filled 2022-01-20: qty 2

## 2022-01-20 MED ORDER — SILVER NITRATE-POT NITRATE 75-25 % EX MISC
CUTANEOUS | Status: AC
  Filled 2022-01-20: qty 10

## 2022-01-20 MED ORDER — KETOROLAC TROMETHAMINE 30 MG/ML IJ SOLN
INTRAMUSCULAR | Status: DC | PRN
  Administered 2022-01-20: 30 mg via INTRAVENOUS

## 2022-01-20 MED ORDER — BUPIVACAINE HCL (PF) 0.5 % IJ SOLN
INTRAMUSCULAR | Status: AC
  Filled 2022-01-20: qty 30

## 2022-01-20 MED ORDER — KETOROLAC TROMETHAMINE 30 MG/ML IJ SOLN: 30.0000 mg | Freq: Once | INTRAMUSCULAR | Status: DC

## 2022-01-20 MED ORDER — OXYCODONE HCL 5 MG PO TABS: 5.0000 mg | ORAL_TABLET | Freq: Once | ORAL | Status: DC | PRN

## 2022-01-20 MED ORDER — POVIDONE-IODINE 10 % EX SWAB: 2.0000 "application " | Freq: Once | CUTANEOUS | Status: DC

## 2022-01-20 MED ORDER — LIDOCAINE 2% (20 MG/ML) 5 ML SYRINGE
INTRAMUSCULAR | Status: AC
  Filled 2022-01-20: qty 5

## 2022-01-20 MED ORDER — LACTATED RINGERS IV SOLN: INTRAVENOUS | Status: DC

## 2022-01-20 MED ORDER — LIDOCAINE HCL (CARDIAC) PF 100 MG/5ML IV SOSY
PREFILLED_SYRINGE | INTRAVENOUS | Status: DC | PRN
  Administered 2022-01-20: 60 mg via INTRATRACHEAL

## 2022-01-20 MED ORDER — PROPOFOL 10 MG/ML IV BOLUS
INTRAVENOUS | Status: AC
  Filled 2022-01-20: qty 20

## 2022-01-20 MED ORDER — ACETAMINOPHEN 500 MG PO TABS: 1000.0000 mg | ORAL_TABLET | Freq: Once | ORAL | Status: DC

## 2022-01-20 MED ORDER — METHYLERGONOVINE MALEATE 0.2 MG/ML IJ SOLN
INTRAMUSCULAR | Status: AC
  Filled 2022-01-20: qty 1

## 2022-01-20 MED ORDER — DEXAMETHASONE SODIUM PHOSPHATE 10 MG/ML IJ SOLN
INTRAMUSCULAR | Status: AC
  Filled 2022-01-20: qty 1

## 2022-01-20 MED ORDER — PROPOFOL 10 MG/ML IV BOLUS
INTRAVENOUS | Status: DC | PRN
  Administered 2022-01-20: 200 mg via INTRAVENOUS

## 2022-01-20 MED ORDER — AMISULPRIDE (ANTIEMETIC) 5 MG/2ML IV SOLN: 10.0000 mg | Freq: Once | INTRAVENOUS | Status: DC | PRN

## 2022-01-20 MED ORDER — CARBOPROST TROMETHAMINE 250 MCG/ML IM SOLN
INTRAMUSCULAR | Status: AC
  Filled 2022-01-20: qty 1

## 2022-01-20 MED ORDER — ONDANSETRON HCL 4 MG/2ML IJ SOLN
INTRAMUSCULAR | Status: DC | PRN
  Administered 2022-01-20: 4 mg via INTRAVENOUS

## 2022-01-20 MED ORDER — FENTANYL CITRATE (PF) 100 MCG/2ML IJ SOLN: 25.0000 ug | INTRAMUSCULAR | Status: DC | PRN

## 2022-01-20 MED ORDER — SODIUM CHLORIDE 0.9 % IV SOLN
INTRAVENOUS | Status: AC
  Filled 2022-01-20 (×2): qty 100

## 2022-01-20 MED ORDER — ONDANSETRON HCL 4 MG/2ML IJ SOLN
INTRAMUSCULAR | Status: AC
  Filled 2022-01-20: qty 2

## 2022-01-20 MED ORDER — PROPOFOL 500 MG/50ML IV EMUL
INTRAVENOUS | Status: DC | PRN
  Administered 2022-01-20: 150 ug/kg/min via INTRAVENOUS

## 2022-01-20 MED ORDER — ACETAMINOPHEN 500 MG PO TABS
ORAL_TABLET | ORAL | Status: AC
  Filled 2022-01-20: qty 2

## 2022-01-20 MED ORDER — OXYCODONE HCL 5 MG/5ML PO SOLN: 5.0000 mg | Freq: Once | ORAL | Status: DC | PRN

## 2022-01-20 MED ORDER — MIDAZOLAM HCL 2 MG/2ML IJ SOLN
INTRAMUSCULAR | Status: AC
  Filled 2022-01-20: qty 2

## 2022-01-20 MED ORDER — MIDAZOLAM HCL 5 MG/5ML IJ SOLN
INTRAMUSCULAR | Status: DC | PRN
Start: 2022-01-20 — End: 2022-01-20
  Administered 2022-01-20: 2 mg via INTRAVENOUS

## 2022-01-20 MED ORDER — OXYTOCIN 10 UNIT/ML IJ SOLN
INTRAMUSCULAR | Status: AC
  Filled 2022-01-20: qty 1

## 2022-01-20 MED ORDER — DEXAMETHASONE SODIUM PHOSPHATE 10 MG/ML IJ SOLN
INTRAMUSCULAR | Status: DC | PRN
  Administered 2022-01-20: 4 mg via INTRAVENOUS

## 2022-01-20 SURGICAL SUPPLY — 21 items
CATH ROBINSON RED A/P 14FR (CATHETERS) ×2 IMPLANT
FILTER UTR ASPR ASSEMBLY (MISCELLANEOUS) IMPLANT
GLOVE SURG LTX SZ7 (GLOVE) ×2 IMPLANT
GLOVE SURG UNDER POLY LF SZ7 (GLOVE) ×2 IMPLANT
GOWN STRL REUS W/ TWL LRG LVL3 (GOWN DISPOSABLE) ×2 IMPLANT
GOWN STRL REUS W/TWL LRG LVL3 (GOWN DISPOSABLE) ×4
HOSE CONNECTING 18IN BERKELEY (TUBING) ×2 IMPLANT
KIT BERKELEY 1ST TRI 3/8 NO TR (MISCELLANEOUS) ×2 IMPLANT
KIT BERKELEY 1ST TRIMESTER 3/8 (MISCELLANEOUS) ×2 IMPLANT
NS IRRIG 1000ML POUR BTL (IV SOLUTION) ×2 IMPLANT
PACK VAGINAL MINOR WOMEN LF (CUSTOM PROCEDURE TRAY) ×2 IMPLANT
PAD OB MATERNITY 4.3X12.25 (PERSONAL CARE ITEMS) ×2 IMPLANT
PAD PREP 24X48 CUFFED NSTRL (MISCELLANEOUS) ×2 IMPLANT
SET BERKELEY SUCTION TUBING (SUCTIONS) ×2 IMPLANT
SLEEVE SCD COMPRESS KNEE MED (STOCKING) ×2 IMPLANT
SPIKE FLUID TRANSFER (MISCELLANEOUS) ×2 IMPLANT
TOWEL GREEN STERILE FF (TOWEL DISPOSABLE) ×2 IMPLANT
VACURETTE 6 ASPIR F TIP BERK (CANNULA) IMPLANT
VACURETTE 7MM CVD STRL WRAP (CANNULA) IMPLANT
VACURETTE 8 RIGID CVD (CANNULA) ×1 IMPLANT
VACURETTE 9 RIGID CVD (CANNULA) IMPLANT

## 2022-01-20 NOTE — Anesthesia Preprocedure Evaluation (Addendum)
Anesthesia Evaluation  ?Patient identified by MRN, date of birth, ID band ?Patient awake ? ? ? ?Reviewed: ?Allergy & Precautions, NPO status , Patient's Chart, lab work & pertinent test results ? ?History of Anesthesia Complications ?(+) MALIGNANT HYPERTHERMIA and history of anesthetic complications (Paternal grandfather) ? ?Airway ?Mallampati: II ? ?TM Distance: >3 FB ?Neck ROM: Full ? ? ? Dental ?no notable dental hx. ? ?  ?Pulmonary ?neg pulmonary ROS,  ?  ?Pulmonary exam normal ?breath sounds clear to auscultation ? ? ? ? ? ? Cardiovascular ?negative cardio ROS ?Normal cardiovascular exam ?Rhythm:Regular Rate:Normal ? ? ?  ?Neuro/Psych ? Headaches, negative psych ROS  ? GI/Hepatic ?negative GI ROS, Neg liver ROS,   ?Endo/Other  ?negative endocrine ROS ? Renal/GU ?negative Renal ROS  ? ?  ?Musculoskeletal ?negative musculoskeletal ROS ?(+)  ? Abdominal ?(+) + obese,   ?Peds ? Hematology ? ?(+) Blood dyscrasia, anemia ,   ?Anesthesia Other Findings ?Missed AB ? Reproductive/Obstetrics ? ?  ? ? ? ? ? ? ? ? ? ? ? ? ? ?  ?  ? ? ? ? ? ? ? ?Anesthesia Physical ?Anesthesia Plan ? ?ASA: 2 ? ?Anesthesia Plan: General  ? ?Post-op Pain Management:   ? ?Induction: Intravenous ? ?PONV Risk Score and Plan: 3 and Ondansetron, Dexamethasone, Propofol infusion, Midazolam, Treatment may vary due to age or medical condition and TIVA ? ?Airway Management Planned: LMA ? ?Additional Equipment:  ? ?Intra-op Plan:  ? ?Post-operative Plan: Extubation in OR ? ?Informed Consent: I have reviewed the patients History and Physical, chart, labs and discussed the procedure including the risks, benefits and alternatives for the proposed anesthesia with the patient or authorized representative who has indicated his/her understanding and acceptance.  ? ? ? ?Dental advisory given ? ?Plan Discussed with: CRNA ? ?Anesthesia Plan Comments:   ? ? ? ? ?Anesthesia Quick Evaluation ? ?

## 2022-01-20 NOTE — Progress Notes (Signed)
CBC drawn and sent to lab at this time. Order placed STAT.  ?

## 2022-01-20 NOTE — Anesthesia Procedure Notes (Signed)
Procedure Name: LMA Insertion ?Date/Time: 01/20/2022 11:45 AM ?Performed by: Thornell Mule, CRNA ?Pre-anesthesia Checklist: Patient identified, Emergency Drugs available, Suction available and Patient being monitored ?Patient Re-evaluated:Patient Re-evaluated prior to induction ?Oxygen Delivery Method: Circle system utilized ?Preoxygenation: Pre-oxygenation with 100% oxygen ?Induction Type: IV induction ?LMA: LMA inserted ?LMA Size: 4.0 ?Number of attempts: 1 ?Placement Confirmation: positive ETCO2 ?Tube secured with: Tape ?Dental Injury: Teeth and Oropharynx as per pre-operative assessment  ? ? ? ? ?

## 2022-01-20 NOTE — Op Note (Signed)
Denise [name] ?PROCEDURE DATE: 01/20/2022 ? ?PREOPERATIVE DIAGNOSIS: Retained products of conception after miscarriage ?POSTOPERATIVE DIAGNOSIS: The same ?PROCEDURE:     Dilation and Evacuation ?SURGEON:  Dr. Verita Schneiders ? ?INDICATIONS: 36 y.o. [id number] with retained products of conception after miscarriage, needing surgical completion.  Risks of surgery were discussed with the [name] including but not limited to: bleeding which may require transfusion; infection which may require antibiotics; injury to uterus or surrounding organs; need for additional procedures including laparotomy or laparoscopy; possibility of intrauterine scarring which may impair future fertility; and other postoperative/anesthesia complications. Written informed consent was obtained.   ? ?FINDINGS:  A 8 week size uterus, moderate amounts of products of conception, specimen sent to pathology. ? ?ANESTHESIA: General-LMA, paracervical block with 30 ml of 0.5% Marcaine. ?ESTIMATED BLOOD LOSS:  20 ml. ?SPECIMENS:  Products of conception sent to pathology ?COMPLICATIONS:  None immediate. ? ?PROCEDURE DETAILS:  The [name] received intravenous Doxycycline while in the preoperative area.  She was then taken to the operating room where general anesthesia was administered and was found to be adequate.  After an adequate timeout was performed, she was placed in the dorsal lithotomy position and examined; then prepped and draped in the sterile manner.   Her bladder was catheterized for an unmeasured amount of clear, yellow urine. A vaginal speculum was then placed in the [name]'s vagina and a single tooth tenaculum was applied to the anterior lip of the cervix.  A paracervical block using 30 ml of 0.5% Marcaine was administered. The cervix was gently dilated to accommodate a 8 mm suction curette that was gently advanced to the uterine fundus. The suction device was then activated and curette slowly rotated to clear the uterus of products of  conception.  Suction curettage was done until complete emptying of the uterus was confirmed. There was minimal bleeding noted and the tenaculum removed with good hemostasis noted.   All instruments were removed from the [name]'s vagina.  Sponge and instrument counts were correct times two  The [name] tolerated the procedure well and was taken to the recovery area extubated, awake, and in stable condition. ? ?The [name] will be discharged to home as per PACU criteria.  Routine postoperative instructions given.  She was prescribed Percocet and Ibuprofen. She will follow up in the office as scheduled for postoperative evaluation. ? ? ?Verita Schneiders, MD, FACOG ?Obstetrician Social research officer, government, Faculty Practice ?[facility] ?

## 2022-01-20 NOTE — Transfer of Care (Signed)
Immediate Anesthesia Transfer of Care Note ? ?Patient: Denise [name] ? ?Procedure(s) Performed: DILATATION AND EVACUATION (Vagina ) ? ?Patient Location: PACU ? ?Anesthesia Type:General ? ?Level of Consciousness: drowsy, patient cooperative and responds to stimulation ? ?Airway & Oxygen Therapy: Patient Spontanous Breathing and Patient connected to face mask oxygen ? ?Post-op Assessment: Report given to RN and Post -op Vital signs reviewed and stable ? ?Post vital signs: Reviewed and stable ? ?Last Vitals:  ?Vitals Value Taken Time  ?BP    ?Temp    ?Pulse 85 01/20/22 1218  ?Resp    ?SpO2 100 % 01/20/22 1218  ?Vitals shown include unvalidated device data. ? ?Last Pain:  ?Vitals:  ? 01/20/22 0954  ?TempSrc: Oral  ?PainSc: 5   ?   ? ?Patients Stated Pain Goal: 4 (01/20/22 0954) ? ?Complications: No notable events documented. ?

## 2022-01-20 NOTE — Discharge Instructions (Signed)
?  Post Anesthesia Home Care Instructions ? ?Activity: ?Get plenty of rest for the remainder of the day. A responsible individual must stay with you for 24 hours following the procedure.  ?For the next 24 hours, DO NOT: ?-Drive a car ?-Advertising copywriter ?-Drink alcoholic beverages ?-Take any medication unless instructed by your physician ?-Make any legal decisions or sign important papers. ? ?Meals: ?Start with liquid foods such as gelatin or soup. Progress to regular foods as tolerated. Avoid greasy, spicy, heavy foods. If nausea and/or vomiting occur, drink only clear liquids until the nausea and/or vomiting subsides. Call your physician if vomiting continues. ? ?Special Instructions/Symptoms: ?Your throat may feel dry or sore from the anesthesia or the breathing tube placed in your throat during surgery. If this causes discomfort, gargle with warm salt water. The discomfort should disappear within 24 hours. ? ?If you had a scopolamine patch placed behind your ear for the management of post- operative nausea and/or vomiting: ? ?1. The medication in the patch is effective for 72 hours, after which it should be removed.  Wrap patch in a tissue and discard in the trash. Wash hands thoroughly with soap and water. ?2. You may remove the patch earlier than 72 hours if you experience unpleasant side effects which may include dry mouth, dizziness or visual disturbances. ?3. Avoid touching the patch. Wash your hands with soap and water after contact with the patch. ?    ?No Ibuprofen until 6:00pm today. ?

## 2022-01-20 NOTE — Anesthesia Postprocedure Evaluation (Signed)
Anesthesia Post Note ? ?Patient: Denise [name] ? ?Procedure(s) Performed: DILATATION AND EVACUATION (Vagina ) ? ?  ? ?Patient location during evaluation: PACU ?Anesthesia Type: General ?Level of consciousness: awake ?Pain management: pain level controlled ?Vital Signs Assessment: post-procedure vital signs reviewed and stable ?Respiratory status: spontaneous breathing, nonlabored ventilation, respiratory function stable and patient connected to nasal cannula oxygen ?Cardiovascular status: blood pressure returned to baseline and stable ?Postop Assessment: no apparent nausea or vomiting ?Anesthetic complications: no ? ? ?No notable events documented. ? ?Last Vitals:  ?Vitals:  ? 01/20/22 1245 01/20/22 1305  ?BP: 121/84 (!) 117/98  ?Pulse: 68 77  ?Resp: 12 13  ?Temp:  36.4 ?C  ?SpO2: 95% 99%  ?  ?Last Pain:  ?Vitals:  ? 01/20/22 1305  ?TempSrc:   ?PainSc: 0-No pain  ? ? ?  ?  ?  ?  ?  ?  ? ?Kylii Ennis P Jaleesa Cervi ? ? ? ? ?

## 2022-01-21 ENCOUNTER — Encounter (HOSPITAL_BASED_OUTPATIENT_CLINIC_OR_DEPARTMENT_OTHER): Payer: Self-pay | Admitting: Obstetrics & Gynecology

## 2022-01-21 LAB — SURGICAL PATHOLOGY

## 2022-01-23 ENCOUNTER — Other Ambulatory Visit: Payer: Self-pay | Admitting: Obstetrics & Gynecology

## 2022-01-27 ENCOUNTER — Other Ambulatory Visit: Payer: Self-pay

## 2022-01-27 ENCOUNTER — Ambulatory Visit: Payer: 59 | Admitting: Family Medicine

## 2022-01-27 ENCOUNTER — Encounter: Payer: Self-pay | Admitting: Family Medicine

## 2022-01-27 VITALS — BP 127/84 | HR 84 | Wt 199.0 lb

## 2022-01-27 DIAGNOSIS — O034 Incomplete spontaneous abortion without complication: Secondary | ICD-10-CM

## 2022-01-27 NOTE — Progress Notes (Signed)
? ?  Subjective:  ? ? Patient ID: Denise [name], female    DOB: 06-04-1986, 36 y.o.   MRN: [id number] ? ?HPI ? ?Patient seen for postop follow-up of D&E for incomplete AB.  The patient has had very little bleeding since the procedure last week.  She reports no pain or other problems. ? ?Review of Systems ? ?   ?Objective:  ? Physical Exam ?Vitals reviewed.  ?Constitutional:   ?   Appearance: Normal appearance.  ?Cardiovascular:  ?   Rate and Rhythm: Normal rate.  ?Pulmonary:  ?   Effort: Pulmonary effort is normal.  ?Abdominal:  ?   General: Abdomen is flat. There is no distension.  ?   Palpations: Abdomen is soft.  ?   Tenderness: There is no abdominal tenderness. There is no guarding.  ?Skin: ?   General: Skin is warm and dry.  ?   Capillary Refill: Capillary refill takes less than 2 seconds.  ?Neurological:  ?   General: No focal deficit present.  ?   Mental Status: She is alert.  ?Psychiatric:     ?   Mood and Affect: Mood normal.     ?   Behavior: Behavior normal.     ?   Thought Content: Thought content normal.  ? ?   ?Assessment & Plan:  ?1. Incomplete abortion ?Now completed.  Pathology shows normal gestational tissue.  Patient is not anticipating becoming pregnant over the next few months.  She plans on using condoms for birth control.  Recommended taking multivitamin. ? ?

## 2022-02-02 ENCOUNTER — Other Ambulatory Visit: Payer: Self-pay

## 2022-02-02 DIAGNOSIS — N96 Recurrent pregnancy loss: Secondary | ICD-10-CM

## 2022-02-02 DIAGNOSIS — F32A Depression, unspecified: Secondary | ICD-10-CM

## 2022-02-03 MED ORDER — LORAZEPAM 1 MG PO TABS: 0.5000 mg | ORAL_TABLET | Freq: Three times a day (TID) | ORAL | 0 refills | Status: DC | PRN

## 2022-02-03 MED ORDER — CITALOPRAM HYDROBROMIDE 10 MG PO TABS: 10.0000 mg | ORAL_TABLET | Freq: Every day | ORAL | 3 refills | Status: AC

## 2022-02-03 NOTE — Addendum Note (Signed)
Addended by: Levie Heritage on: 02/03/2022 06:14 PM ? ? Modules accepted: Orders ? ?

## 2022-02-03 NOTE — BH Specialist Note (Signed)
Pt did not arrive to video visit and did not answer the phone; Left HIPPA-compliant message to call back Shera Laubach from [facility] at  [phone] (Maci Eickholt's office).  ?; left MyChart message for patient.  ? ?

## 2022-02-05 ENCOUNTER — Other Ambulatory Visit: Payer: Self-pay

## 2022-02-05 ENCOUNTER — Telehealth (INDEPENDENT_AMBULATORY_CARE_PROVIDER_SITE_OTHER): Payer: 59 | Admitting: Family Medicine

## 2022-02-05 DIAGNOSIS — F53 Postpartum depression: Secondary | ICD-10-CM | POA: Diagnosis not present

## 2022-02-05 DIAGNOSIS — R69 Illness, unspecified: Secondary | ICD-10-CM | POA: Diagnosis not present

## 2022-02-05 NOTE — Progress Notes (Signed)
? ? ?GYNECOLOGY VIRTUAL VISIT ENCOUNTER NOTE ? ?Provider location: [facility] [employer]  ? ?Patient location: Home ? ?I connected with Denise [name] on 02/05/22 at 11:15 AM EDT by MyChart Video Encounter and verified that I am speaking with the correct person using two identifiers. ?  ?I discussed the limitations, risks, security and privacy concerns of performing an evaluation and management service virtually and the availability of in person appointments. I also discussed with the patient that there may be a patient responsible charge related to this service. The patient expressed understanding and agreed to proceed. ?  ?History:  ?Denise [name] is a 36 y.o. [phone] female being evaluated today for postpartum anxiety.  She is approximately 2 weeks postop from a D&E after a missed AB.  She contacted my office 2 days ago regarding increased anxiety, difficulty sleeping at night, postpartum depression.  Started her on Celexa 10 mg at night Ativan as needed.  She is started the Celexa and started using Ativan to help sleep at night.  She has noticed that the Ativan 1 mg at bedtime is helpful which she took last night.  At 0.5 mg, she finds that this is less helpful.  She denies suicidal ideation. She has not noticed that the medications are helping as yet.  She does not note any side effects to the medications.  She denies any abnormal vaginal discharge, bleeding, pelvic pain or other concerns.   ?  ?  ?Past Medical History:  ?Diagnosis Date  ? Family history of malignant hyperthermia   ? Paternal grandfather passed from [facility]  ? Kidney stone   ? Migraine   ? ?Past Surgical History:  ?Procedure Laterality Date  ? CESAREAN SECTION N/A 12/29/2019  ? Procedure: CESAREAN SECTION;  Surgeon: Catalina Antigua, MD;  Location: [facility] LD ORS;  Service: Obstetrics;  Laterality: N/A;  ? DILATION AND EVACUATION N/A 01/20/2022  ? Procedure: DILATATION AND EVACUATION;  Surgeon: Tereso Newcomer, MD;   Location: [facility];  Service: Gynecology;  Laterality: N/A;  ? KIDNEY STONE SURGERY  2014  ? L and R   ? ?The following portions of the patient's history were reviewed and updated as appropriate: allergies, current medications, past family history, past medical history, past social history, past surgical history and problem list.  ? ? ?Review of Systems:  ?Pertinent items noted in HPI and remainder of comprehensive ROS otherwise negative. ? ?Physical Exam:  ? ?General:  Alert, oriented and cooperative. Patient appears to be in no acute distress.  ?Mental Status: Normal mood and affect. Normal behavior. Normal judgment and thought content.   ?Respiratory: Normal respiratory effort, no problems with respiration noted  ?Rest of physical exam deferred due to type of encounter ? ?Labs and Imaging ?No results found for this or any previous visit (from the past 336 hour(s)). ?US OB Transvaginal ? ?Result Date: 01/18/2022 ?CLINICAL DATA:  Medical abortion, bleeding. EXAM: OBSTETRIC <14 WK ULTRASOUND TECHNIQUE: Transvaginal ultrasound was performed for evaluation of the gestation as well as the maternal uterus and adnexal regions. COMPARISON:  Obstetrical ultrasound 12/28/2021. FINDINGS: Intrauterine gestational sac: None Endometrium: Heterogeneous and mildly thickened measuring up to 2.2 cm. No vascularity identified in the endometrium. Maternal uterus/adnexae: Ovaries not visualized.  No free fluid. IMPRESSION: 1. No intrauterine gestational sac identified. Heterogeneous thickened endometrium without vascularity. Findings may be related to hemorrhagic products. Recommend follow-up with serial beta HCG. Short-term follow-up ultrasound can be performed to confirm complete resolution. Electronically  Signed   By: Darliss Cheney M.D.   On: 01/18/2022 16:15     ?  ?Assessment and Plan:  ?   ?1. Postpartum depression ?Continue with celexa and ativan. Will refer to counselor. F/u in 3-4 weeks ? ?   ?  ?I discussed  the assessment and treatment plan with the patient. The patient was provided an opportunity to ask questions and all were answered. The patient agreed with the plan and demonstrated an understanding of the instructions. ?  ?The patient was advised to call back or seek an in-person evaluation/go to the ED if the symptoms worsen or if the condition fails to improve as anticipated. ? ?I provided 10 minutes of face-to-face time during this encounter. ? ? ?Levie Heritage, DO ?[facility] [employer], [facility] medical evacuation. ?

## 2022-02-10 ENCOUNTER — Ambulatory Visit: Payer: 59 | Admitting: Clinical

## 2022-02-10 DIAGNOSIS — Z91199 Patient's noncompliance with other medical treatment and regimen due to unspecified reason: Secondary | ICD-10-CM

## 2022-02-23 MED ORDER — LORAZEPAM 1 MG PO TABS: 0.5000 mg | ORAL_TABLET | Freq: Three times a day (TID) | ORAL | 0 refills | Status: AC | PRN

## 2022-02-23 NOTE — Addendum Note (Signed)
Addended by: Levie Heritage on: 02/23/2022 01:27 PM ? ? Modules accepted: Orders ? ?

## 2022-03-04 ENCOUNTER — Telehealth (INDEPENDENT_AMBULATORY_CARE_PROVIDER_SITE_OTHER): Payer: 59 | Admitting: Family Medicine

## 2022-03-04 DIAGNOSIS — F32A Depression, unspecified: Secondary | ICD-10-CM

## 2022-03-04 DIAGNOSIS — R69 Illness, unspecified: Secondary | ICD-10-CM | POA: Diagnosis not present

## 2022-03-04 NOTE — Progress Notes (Signed)
? ? ?  GYNECOLOGY VIRTUAL VISIT ENCOUNTER NOTE ? ?Provider location: [facility]  ? ?Patient location: Home ? ?I connected with Randell Patient on 03/04/22 at  9:35 AM EDT by MyChart Video Encounter and verified that I am speaking with the correct person using two identifiers. ?  ?I discussed the limitations, risks, security and privacy concerns of performing an evaluation and management service virtually and the availability of in person appointments. I also discussed with the patient that there may be a patient responsible charge related to this service. The patient expressed understanding and agreed to proceed. ?  ?History:  ?Denise [name] is a 36 y.o. [phone] female being evaluated today for PPD. She was placed on celexa 10mg  4 weeks ago. It's been working well. She has been taking ativan at night to help with sleep and no longer needs it during the day. She denies any abnormal vaginal discharge, bleeding, pelvic pain or other concerns.   ?  ?  ?Past Medical History:  ?Diagnosis Date  ? Family history of malignant hyperthermia   ? Paternal grandfather passed from [facility]  ? Kidney stone   ? Migraine   ? ?Past Surgical History:  ?Procedure Laterality Date  ? CESAREAN SECTION N/A 12/29/2019  ? Procedure: CESAREAN SECTION;  Surgeon: Mora Bellman, MD;  Location: [place] LD ORS;  Service: Obstetrics;  Laterality: N/A;  ? DILATION AND EVACUATION N/A 01/20/2022  ? Procedure: DILATATION AND EVACUATION;  Surgeon: Osborne Oman, MD;  Location: [place];  Service: Gynecology;  Laterality: N/A;  ? KIDNEY STONE SURGERY  2014  ? L and R   ? ?The following portions of the patient's history were reviewed and updated as appropriate: allergies, current medications, past family history, past medical history, past social history, past surgical history and problem list.  ? ? ?Review of Systems:  ?Pertinent items noted in HPI and remainder of comprehensive ROS otherwise  negative. ? ?Physical Exam:  ? ?General:  Alert, oriented and cooperative. Patient appears to be in no acute distress.  ?Mental Status: Normal mood and affect. Normal behavior. Normal judgment and thought content.   ?Respiratory: Normal respiratory effort, no problems with respiration noted  ?Rest of physical exam deferred due to type of encounter ? ?Labs and Imaging ?No results found for this or any previous visit (from the past 336 hour(s)). ?No results found.   ?  ?Assessment and Plan:  ?   ?1. Depression, unspecified depression type ?Improving. Will continue the celexa 10mg  daily for another few months, then reevaluate and see if we can start to wean off. She is also going to be weaning off the ativan at night by taking half a tablet. ? ?   ?  ?I discussed the assessment and treatment plan with the patient. The patient was provided an opportunity to ask questions and all were answered. The patient agreed with the plan and demonstrated an understanding of the instructions. ?  ?The patient was advised to call back or seek an in-person evaluation/go to the ED if the symptoms worsen or if the condition fails to improve as anticipated. ? ?I provided 10 minutes of face-to-face time during this encounter. ? ? ?Truett Mainland, DO ?[facility] [place] ? ?

## 2022-03-04 NOTE — Progress Notes (Signed)
Virtual visit. Follow up PPD. Allergies, meds and pharmacy confirmed. ?

## 2022-08-06 DIAGNOSIS — G43911 Migraine, unspecified, intractable, with status migrainosus: Secondary | ICD-10-CM | POA: Diagnosis not present

## 2022-12-17 ENCOUNTER — Encounter: Payer: Self-pay | Admitting: Family Medicine

## 2022-12-17 DIAGNOSIS — Z1589 Genetic susceptibility to other disease: Secondary | ICD-10-CM

## 2022-12-17 DIAGNOSIS — N96 Recurrent pregnancy loss: Secondary | ICD-10-CM
